# Patient Record
Sex: Female | Born: 1995
Health system: Southern US, Community
[De-identification: ages and names within clinical notes are randomized; demographics above are authoritative.]

## PROBLEM LIST (undated history)

## (undated) DIAGNOSIS — F419 Anxiety disorder, unspecified: Secondary | ICD-10-CM

## (undated) DIAGNOSIS — Z789 Other specified health status: Secondary | ICD-10-CM

## (undated) HISTORY — PX: INDUCED ABORTION: SHX677

## (undated) HISTORY — PX: WISDOM TOOTH EXTRACTION: SHX21

## (undated) HISTORY — DX: Anxiety disorder, unspecified: F41.9

---
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
## 2014-08-28 ENCOUNTER — Encounter (HOSPITAL_COMMUNITY): Payer: Self-pay | Admitting: Medical

## 2014-08-28 ENCOUNTER — Inpatient Hospital Stay (HOSPITAL_COMMUNITY)
Admission: AD | Admit: 2014-08-28 | Discharge: 2014-08-28 | Disposition: A | Discharge status: Home / Self Care | Payer: BC Managed Care – PPO | Source: Ambulatory Visit | Attending: Obstetrics & Gynecology | Admitting: Obstetrics & Gynecology

## 2014-08-28 DIAGNOSIS — Z3201 Encounter for pregnancy test, result positive: Secondary | ICD-10-CM | POA: Diagnosis present

## 2014-08-28 LAB — URINALYSIS, ROUTINE W REFLEX MICROSCOPIC
Bilirubin Urine: NEGATIVE
Glucose, UA: NEGATIVE mg/dL
Hgb urine dipstick: NEGATIVE
Ketones, ur: NEGATIVE mg/dL
Leukocytes, UA: NEGATIVE
Nitrite: NEGATIVE
PH: 6 (ref 5.0–8.0)
Protein, ur: NEGATIVE mg/dL
Specific Gravity, Urine: 1.025 (ref 1.005–1.030)
Urobilinogen, UA: 0.2 mg/dL (ref 0.0–1.0)

## 2014-08-28 LAB — POCT PREGNANCY, URINE: PREG TEST UR: POSITIVE — AB

## 2014-08-28 NOTE — Discharge Instructions (Signed)
Prenatal Care  WHAT IS PRENATAL CARE?  Prenatal care means health care during your pregnancy, before your baby is born. It is very important to take care of yourself and your baby during your pregnancy by:   Getting early prenatal care. If you know you are pregnant, or think you might be pregnant, call your health care provider as soon as possible. Schedule a visit for a prenatal exam.  Getting regular prenatal care. Follow your health care provider's schedule for blood and other necessary tests. Do not miss appointments.  Doing everything you can to keep yourself and your baby healthy during your pregnancy.  Getting complete care. Prenatal care should include evaluation of the medical, dietary, educational, psychological, and social needs of you and your significant other. The medical and genetic history of your family and the family of your baby's father should be discussed with your health care provider.  Discussing with your health care provider:  Prescription, over-the-counter, and herbal medicines that you take.  Any history of substance abuse, alcohol use, smoking, and illegal drug use.  Any history of domestic abuse and violence.  Immunizations you have received.  Your nutrition and diet.  The amount of exercise you do.  Any environmental and occupational hazards to which you are exposed.  History of sexually transmitted infections for both you and your partner.  Previous pregnancies you have had. WHY IS PRENATAL CARE SO IMPORTANT?  By regularly seeing your health care provider, you help ensure that problems can be identified early so that they can be treated as soon as possible. Other problems might be prevented. Many studies have shown that early and regular prenatal care is important for the health of mothers and their babies.  HOW CAN I TAKE CARE OF MYSELF WHILE I AM PREGNANT?  Here are ways to take care of yourself and your baby:   Start or continue taking your  multivitamin with 400 micrograms (mcg) of folic acid every day.  Get early and regular prenatal care. It is very important to see a health care provider during your pregnancy. Your health care provider will check at each visit to make sure that you and your baby are healthy. If there are any problems, action can be taken right away to help you and your baby.  Eat a healthy diet that includes:  Fruits.  Vegetables.  Foods low in saturated fat.  Whole grains.  Calcium-rich foods, such as milk, yogurt, and hard cheeses.  Drink 6-8 glasses of liquids a day.  Unless your health care provider tells you not to, try to be physically active for 30 minutes, most days of the week. If you are pressed for time, you can get your activity in through 10-minute segments, three times a day.  Do not smoke, drink alcohol, or use drugs. These can cause long-term damage to your baby. Talk with your health care provider about steps to take to stop smoking. Talk with a member of your faith community, a counselor, a trusted friend, or your health care provider if you are concerned about your alcohol or drug use.  Ask your health care provider before taking any medicine, even over-the-counter medicines. Some medicines are not safe to take during pregnancy.  Get plenty of rest and sleep.  Avoid hot tubs and saunas during pregnancy.  Do not have X-rays taken unless absolutely necessary and with the recommendation of your health care provider. A lead shield can be placed on your abdomen to protect your baby when   X-rays are taken in other parts of your body.  Do not empty the cat litter when you are pregnant. It may contain a parasite that causes an infection called toxoplasmosis, which can cause birth defects. Also, use gloves when working in garden areas used by cats.  Do not eat uncooked or undercooked meats or fish.  Do not eat soft, mold-ripened cheeses (Brie, Camembert, and chevre) or soft, blue-veined  cheese (Danish blue and Roquefort).  Stay away from toxic chemicals like:  Insecticides.  Solvents (some cleaners or paint thinners).  Lead.  Mercury.  Sexual intercourse may continue until the end of the pregnancy, unless you have a medical problem or there is a problem with the pregnancy and your health care provider tells you not to.  Do not wear high-heel shoes, especially during the second half of the pregnancy. You can lose your balance and fall.  Do not take long trips, unless absolutely necessary. Be sure to see your health care provider before going on the trip.  Do not sit in one position for more than 2 hours when on a trip.  Take a copy of your medical records when going on a trip. Know where a hospital is located in the city you are visiting, in case of an emergency.  Most dangerous household products will have pregnancy warnings on their labels. Ask your health care provider about products if you are unsure.  Limit or eliminate your caffeine intake from coffee, tea, sodas, medicines, and chocolate.  Many women continue working through pregnancy. Staying active might help you stay healthier. If you have a question about the safety or the hours you work at your particular job, talk with your health care provider.  Get informed:  Read books.  Watch videos.  Go to childbirth classes for you and your significant other.  Talk with experienced moms.  Ask your health care provider about childbirth education classes for you and your partner. Classes can help you and your partner prepare for the birth of your baby.  Ask about a baby doctor (pediatrician) and methods and pain medicine for labor, delivery, and possible cesarean delivery. HOW OFTEN SHOULD I SEE MY HEALTH CARE PROVIDER DURING PREGNANCY?  Your health care provider will give you a schedule for your prenatal visits. You will have visits more often as you get closer to the end of your pregnancy. An average  pregnancy lasts about 40 weeks.  A typical schedule includes visiting your health care provider:   About once each month during your first 6 months of pregnancy.  Every 2 weeks during the next 2 months.  Weekly in the last month, until the delivery date. Your health care provider will probably want to see you more often if:  You are older than 35 years.  Your pregnancy is high risk because you have certain health problems or problems with the pregnancy, such as:  Diabetes.  High blood pressure.  The baby is not growing on schedule, according to the dates of the pregnancy. Your health care provider will do special tests to make sure you and your baby are not having any serious problems. WHAT HAPPENS DURING PRENATAL VISITS?   At your first prenatal visit, your health care provider will do a physical exam and talk to you about your health history and the health history of your partner and your family. Your health care provider will be able to tell you what date to expect your baby to be born on.  Your   first physical exam will include checks of your blood pressure, measurements of your height and weight, and an exam of your pelvic organs. Your health care provider will do a Pap test if you have not had one recently and will do cultures of your cervix to make sure there is no infection.  At each prenatal visit, there will be tests of your blood, urine, blood pressure, weight, and the progress of the baby will be checked.  At your later prenatal visits, your health care provider will check how you are doing and how your baby is developing. You may have a number of tests done as your pregnancy progresses.  Ultrasound exams are often used to check on your baby's growth and health.  You may have more urine and blood tests, as well as special tests, if needed. These may include amniocentesis to examine fluid in the pregnancy sac, stress tests to check how the baby responds to contractions, or a  biophysical profile to measure your baby's well-being. Your health care provider will explain the tests and why they are necessary.  You should be tested for high blood sugar (gestational diabetes) between the 24th and 28th weeks of your pregnancy.  You should discuss with your health care provider your plans to breastfeed or bottle-feed your baby.  Each visit is also a chance for you to learn about staying healthy during pregnancy and to ask questions. Document Released: 09/21/2003 Document Revised: 09/23/2013 Document Reviewed: 12/03/2013 Exit[employer] Patient Information 2015 [employer]. This information is not intended to replace advice given to you by your health care provider. Make sure you discuss any questions you have with your health care provider.  

## 2014-08-28 NOTE — MAU Provider Note (Signed)
Ms. Zackery BarefootDanesha Lazalde is a 18 y.o. G1P0 at 3936w2d by sure LMP who presents to MAU today for pregnancy confirmation. The patient states recent +HPT. She denies abdominal pain, vaginal bleeding or fever. She states mild nausea without vomiting.   BP 114/63 mmHg  Pulse 78  Temp(Src) 98.8 F (37.1 C)  Resp 18  Ht 5\' 5"  (1.651 m)  Wt 184 lb 3.2 oz (83.553 kg)  BMI 30.65 kg/m2  LMP 06/03/2014 (Exact Date) GENERAL: Well-developed, well-nourished female in no acute distress.  HEENT: Normocephalic, atraumatic.   LUNGS: Effort normal HEART: Regular rate  SKIN: Warm, dry and without erythema PSYCH: Normal mood and affect  Results for orders placed or performed during the hospital encounter of 08/28/14 (from the past 24 hour(s))  Pregnancy, urine POC     Status: Abnormal   Collection Time: 08/28/14  7:51 PM  Result Value Ref Range   Preg Test, Ur POSITIVE (A) NEGATIVE    A: Positive pregnancy test  A: Discharge home Pregnancy confirmation letter and list of area OB providers given First trimester warning signs reviewed Patient may return to [facility]U as needed or if her condition were to change or worsen  Marny LowensteinJulie N Wenzel, PA-C  08/28/2014 8:20 PM

## 2014-08-28 NOTE — MAU Note (Signed)
Last period i had was in September. Took home upt and was positive. No bleeding but occ abd cramping.

## 2014-08-28 NOTE — MAU Note (Signed)
Jasmine NippleJulie Wenzel PA spoke with pt in room and then d/c home. Pt only wanted upt to confirm pregnancy.

## 2014-09-17 ENCOUNTER — Encounter (HOSPITAL_COMMUNITY): Payer: Self-pay | Admitting: Emergency Medicine

## 2014-09-17 ENCOUNTER — Emergency Department (HOSPITAL_COMMUNITY): Payer: BC Managed Care – PPO

## 2014-09-17 ENCOUNTER — Emergency Department (HOSPITAL_COMMUNITY)
Admission: EM | Admit: 2014-09-17 | Discharge: 2014-09-18 | Disposition: A | Discharge status: Home / Self Care | Payer: BC Managed Care – PPO | Attending: Emergency Medicine | Admitting: Emergency Medicine

## 2014-09-17 DIAGNOSIS — T1490XA Injury, unspecified, initial encounter: Secondary | ICD-10-CM

## 2014-09-17 DIAGNOSIS — Y92410 Unspecified street and highway as the place of occurrence of the external cause: Secondary | ICD-10-CM | POA: Insufficient documentation

## 2014-09-17 DIAGNOSIS — S20219A Contusion of unspecified front wall of thorax, initial encounter: Secondary | ICD-10-CM | POA: Diagnosis not present

## 2014-09-17 DIAGNOSIS — S60211A Contusion of right wrist, initial encounter: Secondary | ICD-10-CM | POA: Diagnosis not present

## 2014-09-17 DIAGNOSIS — Y998 Other external cause status: Secondary | ICD-10-CM | POA: Diagnosis not present

## 2014-09-17 DIAGNOSIS — Z79899 Other long term (current) drug therapy: Secondary | ICD-10-CM | POA: Diagnosis not present

## 2014-09-17 DIAGNOSIS — Y9389 Activity, other specified: Secondary | ICD-10-CM | POA: Diagnosis not present

## 2014-09-17 DIAGNOSIS — S60221A Contusion of right hand, initial encounter: Secondary | ICD-10-CM

## 2014-09-17 DIAGNOSIS — S6991XA Unspecified injury of right wrist, hand and finger(s), initial encounter: Secondary | ICD-10-CM | POA: Diagnosis present
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
## 2014-09-18 DIAGNOSIS — S60211A Contusion of right wrist, initial encounter: Secondary | ICD-10-CM | POA: Diagnosis not present

## 2014-09-18 MED ORDER — OXYCODONE-ACETAMINOPHEN 5-325 MG PO TABS
1.0000 | ORAL_TABLET | Freq: Once | ORAL | Status: AC
  Administered 2014-09-18: 1 via ORAL
  Filled 2014-09-18: qty 1

## 2014-09-18 MED ORDER — IBUPROFEN 800 MG PO TABS: 800.0000 mg | ORAL_TABLET | Freq: Three times a day (TID) | ORAL | Status: DC

## 2014-09-18 MED ORDER — HYDROCODONE-ACETAMINOPHEN 5-325 MG PO TABS: 1.0000 | ORAL_TABLET | Freq: Four times a day (QID) | ORAL | Status: DC | PRN

## 2014-09-18 NOTE — Discharge Instructions (Signed)
Call for a follow up appointment with a Family or Primary Care Provider.  Return if Symptoms worsen.   Take medication as prescribed.  Ice your contusions 3-4 times a day. Take ibuprofen every 8 hours as indicated on bottle. Do not operate heavy she, drink alcohol taking narcotic pain medication.

## 2014-10-25 ENCOUNTER — Encounter (HOSPITAL_COMMUNITY): Payer: Self-pay | Admitting: Emergency Medicine

## 2014-10-25 ENCOUNTER — Emergency Department (HOSPITAL_COMMUNITY)
Admission: EM | Admit: 2014-10-25 | Discharge: 2014-10-25 | Disposition: A | Discharge status: Home / Self Care | Payer: BLUE CROSS/BLUE SHIELD | Attending: Emergency Medicine | Admitting: Emergency Medicine

## 2014-10-25 ENCOUNTER — Emergency Department (HOSPITAL_COMMUNITY): Payer: BLUE CROSS/BLUE SHIELD

## 2014-10-25 DIAGNOSIS — Z79899 Other long term (current) drug therapy: Secondary | ICD-10-CM | POA: Insufficient documentation

## 2014-10-25 DIAGNOSIS — Z3202 Encounter for pregnancy test, result negative: Secondary | ICD-10-CM | POA: Insufficient documentation

## 2014-10-25 DIAGNOSIS — M549 Dorsalgia, unspecified: Secondary | ICD-10-CM

## 2014-10-25 DIAGNOSIS — M25531 Pain in right wrist: Secondary | ICD-10-CM | POA: Insufficient documentation

## 2014-10-25 DIAGNOSIS — M545 Low back pain: Secondary | ICD-10-CM | POA: Diagnosis present

## 2014-10-25 DIAGNOSIS — Z793 Long term (current) use of hormonal contraceptives: Secondary | ICD-10-CM | POA: Insufficient documentation

## 2014-10-25 DIAGNOSIS — G8911 Acute pain due to trauma: Secondary | ICD-10-CM | POA: Insufficient documentation

## 2014-10-25 LAB — URINALYSIS, ROUTINE W REFLEX MICROSCOPIC
Bilirubin Urine: NEGATIVE
GLUCOSE, UA: NEGATIVE mg/dL
HGB URINE DIPSTICK: NEGATIVE
Ketones, ur: NEGATIVE mg/dL
Leukocytes, UA: NEGATIVE
Nitrite: NEGATIVE
PH: 5.5 (ref 5.0–8.0)
Protein, ur: NEGATIVE mg/dL
Specific Gravity, Urine: 1.025 (ref 1.005–1.030)
UROBILINOGEN UA: 0.2 mg/dL (ref 0.0–1.0)

## 2014-10-25 LAB — POC URINE PREG, ED: Preg Test, Ur: NEGATIVE

## 2014-10-25 MED ORDER — CYCLOBENZAPRINE HCL 10 MG PO TABS: 5.0000 mg | ORAL_TABLET | Freq: Two times a day (BID) | ORAL | Status: DC | PRN
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
## 2014-10-25 NOTE — ED Notes (Signed)
Patient transported to X-ray 
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
## 2014-10-25 NOTE — Discharge Instructions (Signed)
Please call your doctor for a followup appointment within 24-48 hours. When you talk to your doctor please let them know that you were seen in the emergency department and have them acquire all of your records so that they can discuss the findings with you and formulate a treatment plan to fully care for your new and ongoing problems. Please call and set-up an appointment with Orthopedics Please rest and stay hydrated Please take medications as prescribed-Flexeril as a muscle relaxant this can lead to drowsiness. There is to be no drinking alcohol, driving, operating any heavy machinery while on the medications. Please take medications on a full stomach Please apply heat and warm compressions and massage Please follow-up with your chiropractor Please avoid any physical strenuous activity or heavy lifting Please continue to monitor symptoms closely and if symptoms are to worsen or change (fever greater than 101, chills, sweating, nausea, vomiting, chest pain, shortness of breathe, difficulty breathing, weakness, numbness, tingling, worsening or changes to pain pattern, fall, injury, numbness, loss sensation to legs, inability to control urine or bowel movements) please report back to the Emergency Department immediately.    Back Exercises Back exercises help treat and prevent back injuries. The goal is to increase your strength in your belly (abdominal) and back muscles. These exercises can also help with flexibility. Start these exercises when told by your doctor. HOME CARE Back exercises include: Pelvic Tilt.  Lie on your back with your knees bent. Tilt your pelvis until the lower part of your back is against the floor. Hold this position 5 to 10 sec. Repeat this exercise 5 to 10 times. Knee to Chest.  Pull 1 knee up against your chest and hold for 20 to 30 seconds. Repeat this with the other knee. This may be done with the other leg straight or bent, whichever feels better. Then, pull both  knees up against your chest. Sit-Ups or Curl-Ups.  Bend your knees 90 degrees. Start with tilting your pelvis, and do a partial, slow sit-up. Only lift your upper half 30 to 45 degrees off the floor. Take at least 2 to 3 seonds for each sit-up. Do not do sit-ups with your knees out straight. If partial sit-ups are difficult, simply do the above but with only tightening your belly (abdominal) muscles and holding it as told. Hip-Lift.  Lie on your back with your knees flexed 90 degrees. Push down with your feet and shoulders as you raise your hips 2 inches off the floor. Hold for 10 seconds, repeat 5 to 10 times. Back Arches.  Lie on your stomach. Prop yourself up on bent elbows. Slowly press on your hands, causing an arch in your low back. Repeat 3 to 5 times. Shoulder-Lifts.  Lie face down with arms beside your body. Keep hips and belly pressed to floor as you slowly lift your head and shoulders off the floor. Do not overdo your exercises. Be careful in the beginning. Exercises may cause you some mild back discomfort. If the pain lasts for more than 15 minutes, stop the exercises until you see your doctor. Improvement with exercise for back problems is slow.  Document Released: 10/21/2010 Document Revised: 12/11/2011 Document Reviewed: 07/20/2011 [facility] Patient Information 2015 [place], [place]. This information is not intended to replace advice given to you by your health care provider. Make sure you discuss any questions you have with your health care provider. Back Pain, Adult Back pain is very common. The pain often gets better over time. The cause of  back pain is usually not dangerous. Most people can learn to manage their back pain on their own.  HOME CARE   Stay active. Start with short walks on flat ground if you can. Try to walk farther each day.  Do not sit, drive, or stand in one place for more than 30 minutes. Do not stay in bed.  Do not avoid exercise or work. Activity can help  your back heal faster.  Be careful when you bend or lift an object. Bend at your knees, keep the object close to you, and do not twist.  Sleep on a firm mattress. Lie on your side, and bend your knees. If you lie on your back, put a pillow under your knees.  Only take medicines as told by your doctor.  Put ice on the injured area.  Put ice in a plastic bag.  Place a towel between your skin and the bag.  Leave the ice on for 15-20 minutes, 03-04 times a day for the first 2 to 3 days. After that, you can switch between ice and heat packs.  Ask your doctor about back exercises or massage.  Avoid feeling anxious or stressed. Find good ways to deal with stress, such as exercise. GET HELP RIGHT AWAY IF:   Your pain does not go away with rest or medicine.  Your pain does not go away in 1 week.  You have new problems.  You do not feel well.  The pain spreads into your legs.  You cannot control when you poop (bowel movement) or pee (urinate).  Your arms or legs feel weak or lose feeling (numbness).  You feel sick to your stomach (nauseous) or throw up (vomit).  You have belly (abdominal) pain.  You feel like you may pass out (faint). MAKE SURE YOU:   Understand these instructions.  Will watch your condition.  Will get help right away if you are not doing well or get worse. Document Released: 03/06/2008 Document Revised: 12/11/2011 Document Reviewed: 01/20/2014 [facility] Patient Information 2015 [employer], [place]. This information is not intended to replace advice given to you by your health care provider. Make sure you discuss any questions you have with your health care provider.   Emergency Department Resource Guide 1) Find a Doctor and Pay Out of Pocket Although you won't have to find out who is covered by your insurance plan, it is a good idea to ask around and get recommendations. You will then need to call the office and see if the doctor you have chosen will accept you  as a new patient and what types of options they offer for patients who are self-pay. Some doctors offer discounts or will set up payment plans for their patients who do not have insurance, but you will need to ask so you aren't surprised when you get to your appointment.  2) Contact Your Local Health Department Not all health departments have doctors that can see patients for sick visits, but many do, so it is worth a call to see if yours does. If you don't know where your local health department is, you can check in your phone book. The CDC also has a tool to help you locate your state's health department, and many state websites also have listings of all of their local health departments.  3) Find a Walk-in Clinic If your illness is not likely to be very severe or complicated, you may want to try a walk in clinic. These are popping up  all over the country in pharmacies, drugstores, and shopping centers. They're usually staffed by nurse practitioners or physician assistants that have been trained to treat common illnesses and complaints. They're usually fairly quick and inexpensive. However, if you have serious medical issues or chronic medical problems, these are probably not your best option.  No Primary Care Doctor: - Call Health Connect at  [phone] - they can help you locate a primary care doctor that  accepts your insurance, provides certain services, etc. - Physician Referral Service- [phone]  Chronic Pain Problems: Organization         Address  Phone   Notes  [facility]  [phone] Patients need to be referred by their primary care doctor.   Medication Assistance: Organization         Address  Phone   Notes  [facility] Medication [facility] [address] [place], [place] [address] [phone] --Must be a resident of [facility] -- Must have NO insurance coverage whatsoever (no Medicaid/ Medicare, etc.) -- The pt. MUST have  a primary care doctor that directs their care regularly and follows them in the community   MedAssist  [phone]   [employer]  [phone]    Agencies that provide inexpensive medical care: Organization         Address  Phone   Notes  [place]  [phone]   [facility]    [phone]   [facility] [place], [place] [address] [phone]   [place] [address] [place] [phone]   Planned Parenthood    [phone]   [facility]    [phone]   [place] [address], [place] Phone:  [phone], Fax:  [phone] Hours of Operation:  9 am - 6 pm, M-F.  Also accepts Medicaid/Medicare and self-pay.  [facility] [place] [address], [address], [place] Phone: [phone], Fax: [phone]. Hours of Operation:  8:30 am - 5:30 pm, M-F.  Also accepts Medicaid and self-pay.  [facility] [place] [address] [place] Phone: [phone]   [place], [place], [place] [phone], Ext. [phone] Mondays & Thursdays: 7-9 AM.  First 15 patients are seen on a first come, first serve basis.    [place] Providers:  Organization         Address  Phone   Notes  [facility] [address], [place] [phone] Also accepts self-pay patients.  [facility] [id number] [place], [place]  [phone]   [place], [address], [place] [phone]   [facility] [address], [place] [phone]   Lucianne Lei [address], [place]   [phone] Only accepts [place] Access [place] patients after they have their name applied to their card.   Self-Pay (no insurance) in [facility]:  Organization         Address  Phone   Notes  Sickle Cell Patients, [facility] [place] [phone]   [facility] Urgent Care [place] [phone]   [facility]gent [place]  [address] [place]  [address], [place] [phone]   [facility]/Dr. Osei-Bonsu  [address], [place] or [address], [place] [phone] Phone number for both [place] and [place] locations is the same.  Urgent Medical and [facility] [address], [place] [phone]   [facility] [address] [place] or [address] [phone]   [facility] [address] [place] [phone], phone; [phone], fax Sees patients 1st and 3rd Saturday of every month.  Must not qualify for public or private insurance (i.e. Medicaid, Medicare, [place] Health Choice, Veterans' Benefits)  Household income should be no more than 200% of the poverty level The clinic cannot treat you if you are pregnant or think you are pregnant  Sexually transmitted diseases are not treated at the clinic.    Dental Care: Organization         Address  Phone  Notes  [facility] Department of [facility] [place] [phone] Accepts children up to age 52 who are enrolled in [place] or [place]; pregnant women with a Medicaid card; and children who have applied for Medicaid or [facility] Health Choice, but were declined, whose parents can pay a reduced fee at time of service.  [facility] Department of [facility]  [address], [place] [phone] Accepts children up to age 45 who are enrolled in [place] or [place]; pregnant women with a Medicaid card; and children who have applied for Medicaid or  Health Choice, but were declined, whose parents can  pay a reduced fee at time of service.  [facility] Adult [facility] Access PROGRAM  [place] [phone] Patients are seen by appointment only. Walk-ins are not accepted. [place] will see patients 87 years of age and older. Monday - Tuesday (8am-5pm) Most Wednesdays (8:30-5pm) $30 per visit, cash only  [facility] Adult Dental Access PROGRAM  [address], [facility] [phone] Patients are seen by appointment only. Walk-ins are not accepted. [place] will see patients 52 years of age and older. One Wednesday Evening (Monthly: Volunteer Based).  $30 per visit, cash only  [place]  [phone] for adults; Children under age 52, call Graduate Pediatric Dentistry at [phone]. Children aged 52-14, please call [phone] to request a pediatric application.  Dental services are provided in all areas of dental care including fillings, crowns and bridges, complete and partial dentures, implants, gum treatment, root canals, and extractions. Preventive care is also provided. Treatment is provided to both adults and children. Patients are selected via a lottery and there is often a waiting list.   [facility] [address] [place]  [phone] [email or web address]   [facility] [address] [place], [place] [phone], Ext. [phone] Second and Fourth Thursday of each month, opens at 6:30 AM; Clinic ends at 9 AM.  Patients are seen on a first-come first-served basis, and a limited number are seen during each clinic.   [facility]  [address] Hillard Danker [place], [place] [phone]   Eligibility Requirements You must have lived in [place], [place], or [place] counties for at least the last three months.   You cannot be eligible for state or federal sponsored [employer], including [employer], [place], or [employer].  You generally cannot be eligible for healthcare  insurance through your employer.    How to apply: Eligibility screenings are held every Tuesday and Wednesday afternoon from 1:00 pm until 4:00 pm. You do not need an appointment for the interview!  [facility] [address] [place], [place]   [place]  [place] Department  [place]  [phone]    Behavioral Health Resources in the Community: Intensive Outpatient Programs Organization         Address  Phone  Notes  [place]. [address] [place], [place] [phone]   [facility] Outpatient [address], [place], [place]   ADS: Alcohol & Drug Svcs [address], [place], [place]   [place] [address],  [place], [place] or [phone]   Substance Abuse Resources Organization         Address  Phone  Notes  Alcohol and Drug Services  [phone]   [place]  [phone]   [facility] [place]   Chinita Pester  [phone]   Residential & Outpatient Substance Abuse Program  [phone]   Psychological Services Organization         Address  Phone  Notes  [facility]  [place]  [phone]   [place] [address] [place] or [phone]    Mobile Crisis Teams Organization         Address  Phone  Notes  [facility], Mobile Crisis Care Unit  [phone]   [facility]  [address] [place], [place]   [place] [address], [place] [phone]    Self-Help/Support Groups Organization         Address  Phone             Notes  [place]. of [place] - variety of support groups  [place] Call for more information  Narcotics Anonymous (NA),  [facility] [address], [employer] [place]  2 meetings at this location   Special educational needs teacher         Address  Phone  Notes  [facility],    [place]  1-236-766-3875   [facility]  [address], [place] [id number], [place], [place]   [place], [place] [phone] Admissions: 8am-3pm M-F  Incentives Substance [place] [address],    [place], [place] [phone]   [facility] [address] [place], [place], [place]   [facility] [address],  [place], [place]   [facility] Programs - Intensive Outpatient [address] Kristeen [address], [place], [place]   [facility] [place].,  [address], [place] [phone] or [phone]   [facility] ([facility]) [address], [place], [place] Accepts Medicaid  [facility] [place] [address]  [place] [phone]) [phone] Substance Abuse/Addiction Treatment   [facility] Organization         Address  Phone  Notes  [facility]  [phone]   Domenic Schwab, PhD [address] [place], [place]   [phone] or [phone]   [place]  [place], [place] [phone]   [facility] [address], [place], [place] [phone] Insurance/Medicaid/sponsorship through [facility] and Families [address], [address] [place], [place] [phone] [place], [place] [phone]    Dr. Adele Schilder  [phone]   [facility] Dept. 1) [address] [place] 2) [place] 3)  [address], [place] [phone]  [phone]    [place] [phone] or [phone] (After Hours)

## 2014-12-24 ENCOUNTER — Encounter (HOSPITAL_COMMUNITY): Payer: Self-pay | Admitting: Emergency Medicine

## 2014-12-24 ENCOUNTER — Emergency Department (HOSPITAL_COMMUNITY)
Admission: EM | Admit: 2014-12-24 | Discharge: 2014-12-24 | Disposition: A | Discharge status: Home / Self Care | Payer: BLUE CROSS/BLUE SHIELD | Attending: Emergency Medicine | Admitting: Emergency Medicine

## 2014-12-24 DIAGNOSIS — Z793 Long term (current) use of hormonal contraceptives: Secondary | ICD-10-CM | POA: Insufficient documentation

## 2014-12-24 DIAGNOSIS — Z87828 Personal history of other (healed) physical injury and trauma: Secondary | ICD-10-CM | POA: Insufficient documentation

## 2014-12-24 DIAGNOSIS — M545 Low back pain: Secondary | ICD-10-CM | POA: Insufficient documentation

## 2014-12-24 DIAGNOSIS — Z791 Long term (current) use of non-steroidal anti-inflammatories (NSAID): Secondary | ICD-10-CM | POA: Diagnosis not present

## 2014-12-24 DIAGNOSIS — Z79899 Other long term (current) drug therapy: Secondary | ICD-10-CM | POA: Insufficient documentation

## 2014-12-24 DIAGNOSIS — G8929 Other chronic pain: Secondary | ICD-10-CM | POA: Diagnosis not present

## 2014-12-24 DIAGNOSIS — M549 Dorsalgia, unspecified: Secondary | ICD-10-CM

## 2014-12-24 MED ORDER — MELOXICAM 7.5 MG PO TABS: 7.5000 mg | ORAL_TABLET | Freq: Every day | ORAL | Status: DC

## 2014-12-24 NOTE — ED Notes (Signed)
Pt c/o chronic back pain with exacerbation onset yesterday. Denies incontinence of bowel or bladder.

## 2014-12-24 NOTE — Discharge Instructions (Signed)
Read the information below.  Use the prescribed medication as directed.  Please discuss all new medications with your pharmacist.  You may return to the Emergency Department at any time for worsening condition or any new symptoms that concern you.   If there is any possibility that you might be pregnant, please let your health care provider know and discuss this with the pharmacist to ensure medication safety.   If you develop fevers, loss of control of bowel or bladder, weakness or numbness in your legs, or are unable to walk, return to the ER for a recheck.    Back Exercises These exercises may help you when beginning to rehabilitate your injury. Your symptoms may resolve with or without further involvement from your physician, physical therapist or athletic trainer. While completing these exercises, remember:   Restoring tissue flexibility helps normal motion to return to the joints. This allows healthier, less painful movement and activity.  An effective stretch should be held for at least 30 seconds.  A stretch should never be painful. You should only feel a gentle lengthening or release in the stretched tissue. STRETCH - Extension, Prone on Elbows   Lie on your stomach on the floor, a bed will be too soft. Place your palms about shoulder width apart and at the height of your head.  Place your elbows under your shoulders. If this is too painful, stack pillows under your chest.  Allow your body to relax so that your hips drop lower and make contact more completely with the floor.  Hold this position for __________ seconds.  Slowly return to lying flat on the floor. Repeat __________ times. Complete this exercise __________ times per day.  RANGE OF MOTION - Extension, Prone Press Ups   Lie on your stomach on the floor, a bed will be too soft. Place your palms about shoulder width apart and at the height of your head.  Keeping your back as relaxed as possible, slowly straighten your  elbows while keeping your hips on the floor. You may adjust the placement of your hands to maximize your comfort. As you gain motion, your hands will come more underneath your shoulders.  Hold this position __________ seconds.  Slowly return to lying flat on the floor. Repeat __________ times. Complete this exercise __________ times per day.  RANGE OF MOTION- Quadruped, Neutral Spine   Assume a hands and knees position on a firm surface. Keep your hands under your shoulders and your knees under your hips. You may place padding under your knees for comfort.  Drop your head and point your tail bone toward the ground below you. This will round out your low back like an angry cat. Hold this position for __________ seconds.  Slowly lift your head and release your tail bone so that your back sags into a large arch, like an old horse.  Hold this position for __________ seconds.  Repeat this until you feel limber in your low back.  Now, find your "sweet spot." This will be the most comfortable position somewhere between the two previous positions. This is your neutral spine. Once you have found this position, tense your stomach muscles to support your low back.  Hold this position for __________ seconds. Repeat __________ times. Complete this exercise __________ times per day.  STRETCH - Flexion, Single Knee to Chest   Lie on a firm bed or floor with both legs extended in front of you.  Keeping one leg in contact with the floor, bring  your opposite knee to your chest. Hold your leg in place by either grabbing behind your thigh or at your knee.  Pull until you feel a gentle stretch in your low back. Hold __________ seconds.  Slowly release your grasp and repeat the exercise with the opposite side. Repeat __________ times. Complete this exercise __________ times per day.  STRETCH - Hamstrings, Standing  Stand or sit and extend your right / left leg, placing your foot on a chair or foot  stool  Keeping a slight arch in your low back and your hips straight forward.  Lead with your chest and lean forward at the waist until you feel a gentle stretch in the back of your right / left knee or thigh. (When done correctly, this exercise requires leaning only a small distance.)  Hold this position for __________ seconds. Repeat __________ times. Complete this stretch __________ times per day. STRENGTHENING - Deep Abdominals, Pelvic Tilt   Lie on a firm bed or floor. Keeping your legs in front of you, bend your knees so they are both pointed toward the ceiling and your feet are flat on the floor.  Tense your lower abdominal muscles to press your low back into the floor. This motion will rotate your pelvis so that your tail bone is scooping upwards rather than pointing at your feet or into the floor.  With a gentle tension and even breathing, hold this position for __________ seconds. Repeat __________ times. Complete this exercise __________ times per day.  STRENGTHENING - Abdominals, Crunches   Lie on a firm bed or floor. Keeping your legs in front of you, bend your knees so they are both pointed toward the ceiling and your feet are flat on the floor. Cross your arms over your chest.  Slightly tip your chin down without bending your neck.  Tense your abdominals and slowly lift your trunk high enough to just clear your shoulder blades. Lifting higher can put excessive stress on the low back and does not further strengthen your abdominal muscles.  Control your return to the starting position. Repeat __________ times. Complete this exercise __________ times per day.  STRENGTHENING - Quadruped, Opposite UE/LE Lift   Assume a hands and knees position on a firm surface. Keep your hands under your shoulders and your knees under your hips. You may place padding under your knees for comfort.  Find your neutral spine and gently tense your abdominal muscles so that you can maintain this  position. Your shoulders and hips should form a rectangle that is parallel with the floor and is not twisted.  Keeping your trunk steady, lift your right hand no higher than your shoulder and then your left leg no higher than your hip. Make sure you are not holding your breath. Hold this position __________ seconds.  Continuing to keep your abdominal muscles tense and your back steady, slowly return to your starting position. Repeat with the opposite arm and leg. Repeat __________ times. Complete this exercise __________ times per day. Document Released: 10/06/2005 Document Revised: 12/11/2011 Document Reviewed: 12/31/2008 [facility] Patient Information 2015 [place]. This information is not intended to replace advice given to you by your health care provider. Make sure you discuss any questions you have with your health care provider.  Back Pain, Adult Low back pain is very common. About 1 in 5 people have back pain.The cause of low back pain is rarely dangerous. The pain often gets better over time.About half of people with a sudden onset  of back pain feel better in just 2 weeks. About 8 in 10 people feel better by 6 weeks.  CAUSES Some common causes of back pain include:  Strain of the muscles or ligaments supporting the spine.  Wear and tear (degeneration) of the spinal discs.  Arthritis.  Direct injury to the back. DIAGNOSIS Most of the time, the direct cause of low back pain is not known.However, back pain can be treated effectively even when the exact cause of the pain is unknown.Answering your caregiver's questions about your overall health and symptoms is one of the most accurate ways to make sure the cause of your pain is not dangerous. If your caregiver needs more information, he or she may order lab work or imaging tests (X-rays or MRIs).However, even if imaging tests show changes in your back, this usually does not require surgery. HOME CARE INSTRUCTIONS For many  people, back pain returns.Since low back pain is rarely dangerous, it is often a condition that people can learn to [facility] their own.   Remain active. It is stressful on the back to sit or stand in one place. Do not sit, drive, or stand in one place for more than 30 minutes at a time. Take short walks on level surfaces as soon as pain allows.Try to increase the length of time you walk each day.  Do not stay in bed.Resting more than 1 or 2 days can delay your recovery.  Do not avoid exercise or work.Your body is made to move.It is not dangerous to be active, even though your back may hurt.Your back will likely heal faster if you return to being active before your pain is gone.  Pay attention to your body when you bend and lift. Many people have less discomfortwhen lifting if they bend their knees, keep the load close to their bodies,and avoid twisting. Often, the most comfortable positions are those that put less stress on your recovering back.  Find a comfortable position to sleep. Use a firm mattress and lie on your side with your knees slightly bent. If you lie on your back, put a pillow under your knees.  Only take over-the-counter or prescription medicines as directed by your caregiver. Over-the-counter medicines to reduce pain and inflammation are often the most helpful.Your caregiver may prescribe muscle relaxant drugs.These medicines help dull your pain so you can more quickly return to your normal activities and healthy exercise.  Put ice on the injured area.  Put ice in a plastic bag.  Place a towel between your skin and the bag.  Leave the ice on for 15-20 minutes, 03-04 times a day for the first 2 to 3 days. After that, ice and heat may be alternated to reduce pain and spasms.  Ask your caregiver about trying back exercises and gentle massage. This may be of some benefit.  Avoid feeling anxious or stressed.Stress increases muscle tension and can worsen back pain.It  is important to recognize when you are anxious or stressed and learn ways to manage it.Exercise is a great option. SEEK MEDICAL CARE IF:  You have pain that is not relieved with rest or medicine.  You have pain that does not improve in 1 week.  You have new symptoms.  You are generally not feeling well. SEEK IMMEDIATE MEDICAL CARE IF:   You have pain that radiates from your back into your legs.  You develop new bowel or bladder control problems.  You have unusual weakness or numbness in your arms or legs.  You develop nausea or vomiting.  You develop abdominal pain.  You feel faint. Document Released: 09/18/2005 Document Revised: 03/19/2012 Document Reviewed: 01/20/2014 [facility] Patient Information 2015 [place], [place]. This information is not intended to replace advice given to you by your health care provider. Make sure you discuss any questions you have with your health care provider.    Emergency Department Resource Guide 1) Find a Doctor and Pay Out of Pocket Although you won't have to find out who is covered by your insurance plan, it is a good idea to ask around and get recommendations. You will then need to call the office and see if the doctor you have chosen will accept you as a new patient and what types of options they offer for patients who are self-pay. Some doctors offer discounts or will set up payment plans for their patients who do not have insurance, but you will need to ask so you aren't surprised when you get to your appointment.  2) Contact Your Local Health Department Not all health departments have doctors that can see patients for sick visits, but many do, so it is worth a call to see if yours does. If you don't know where your local health department is, you can check in your phone book. The CDC also has a tool to help you locate your state's health department, and many state websites also have listings of all of their local health departments.  3) Find a  Walk-in Clinic If your illness is not likely to be very severe or complicated, you may want to try a walk in clinic. These are popping up all over the country in pharmacies, drugstores, and shopping centers. They're usually staffed by nurse practitioners or physician assistants that have been trained to treat common illnesses and complaints. They're usually fairly quick and inexpensive. However, if you have serious medical issues or chronic medical problems, these are probably not your best option.  No Primary Care Doctor: - Call Health Connect at  [phone] - they can help you locate a primary care doctor that  accepts your insurance, provides certain services, etc. - Physician Referral Service- [phone]  Chronic Pain Problems: Organization         Address  Phone   Notes  [facility]  [phone] Patients need to be referred by their primary care doctor.   Medication Assistance: Organization         Address  Phone   Notes  [facility] Medication [facility] [address] [place], [place] [address] [phone] --Must be a resident of [facility] -- Must have NO insurance coverage whatsoever (no Medicaid/ Medicare, etc.) -- The pt. MUST have a primary care doctor that directs their care regularly and follows them in the community   MedAssist  [phone]   [employer]  [phone]    Agencies that provide inexpensive medical care: Organization         Address  Phone   Notes  [facility]  [phone]   [facility]    [phone]   [facility] [address] [place], [place] [address] [phone]   [facility] [address], [place] [phone]   Planned Parenthood    [phone]   [facility]    [phone]   Community Health and [facility]  [address], [place] Phone:  [phone],  Fax:  [phone]  [phone] Hours of Operation:  9 am - 6 pm, M-F.  Also accepts Medicaid/Medicare and self-pay.  [facility]  [address], [place] Phone: [phone], Fax: [phone]. Hours of Operation:  8:30 am - 5:30 pm, M-F.  Also accepts Medicaid and self-pay.  [facility] [place] [address], [place] Phone: [phone]   [facility] [address] Natasha Bence [place], [place] [phone], Ext. [phone] Mondays & Thursdays: 7-9 AM.  First 15 patients are seen on a first come, first serve basis.    Medicaid-accepting [facility] Providers:  Organization         Address  Phone   Notes  [facility] [address], [place] [phone] Also accepts self-pay patients.  [facility] [address] Laurell Josephs [place], [place]  [phone]   [facility] [address], [place] [phone]   [facility] [address], [place] [phone]   Renaye Rakers [address], [place]   [phone] Only accepts [place] Access [place] patients after they have their name applied to their card.   Self-Pay (no insurance) in [facility]:  Organization         Address  Phone   Notes  Sickle Cell Patients, [facility] Internal Medicine [address] [place], [place] [phone]   [facility] [address] [place], [place] [phone]   [facility] [place]  [address], [place] [phone]   [facility]/Dr. Osei-Bonsu  [address], [place] or [address], [place] [phone] Phone number for both [place] and [place] locations is the same.  [facility] [address] [place] [phone]   [facility] [address], [place] or [address] [phone]     [facility] [address] [place] [phone], phone; [phone], fax Sees patients 1st and 3rd Saturday of every month.  Must not qualify for public or private insurance (i.e. Medicaid, Medicare, [place] Health Choice, Veterans' Benefits)  Household income should be no more than 200% of the poverty level The clinic cannot treat you if you are pregnant or think you are pregnant  Sexually transmitted diseases are not treated at the clinic.    Dental Care: Organization         Address  Phone  Notes  [facility] [address] [place], [place] [phone] Accepts children up to age 71 who are enrolled in [place] or [place] Health Choice; pregnant women with a Medicaid card; and children who have applied for Medicaid or [place] Health Choice, but were declined, whose parents can pay a reduced fee at time of service.  [facility]  [address], [place] [phone] Accepts children up to age 68 who are enrolled in [place] or [place] Health Choice; pregnant women with a Medicaid card; and children who have applied for Medicaid or [place] Health Choice, but were declined, whose parents can pay a reduced fee at time of service.  [facility]  [address] [place], [place] [phone] Patients are seen by appointment only. Walk-ins are  not accepted. [facility] will see patients 34 years of age and older. Monday - Tuesday (8am-5pm) Most Wednesdays (8:30-5pm) $30 per visit, cash only  [facility] Adult Dental Access [facility]  [address], [facility] [phone] Patients are seen by appointment only. Walk-ins are not accepted. [facility] will see patients 75 years of age and older. One Wednesday Evening (Monthly: Volunteer Based).  $30 per visit, cash only  [employer]  [phone] for adults; Children under age 88, call  [facility] at [phone]. Children aged 55-14, please call [phone] to request a pediatric application.  Dental services are provided in all areas of dental care including fillings, crowns and bridges, complete and partial dentures, implants, gum treatment, root canals, and extractions. Preventive care is also provided. Treatment is provided to both adults and children. Patients are selected via a lottery and there is often a waiting list.   [facility] [address], [place]  [phone] [email or web address]   [facility] [address] [place], [place] [phone], Ext. [phone] Second and Fourth Thursday of each month, opens at 6:30 AM; Clinic ends at 9 AM.  Patients are seen on a first-come first-served basis, and a limited number are seen during each clinic.   [facility]  [address] Ether Griffins [place], [place] [phone]   Eligibility Requirements You must have lived in [place], [place], or [place] counties for at least the last three months.   You cannot be eligible for state or federal sponsored [place], including [employer], [place], or [employer].   You generally cannot be eligible for healthcare insurance through your employer.    How to apply: Eligibility screenings are held every Tuesday and Wednesday afternoon from 1:00 pm until 4:00 pm. You do not need an appointment for the interview!  [facility] [address] [place], [place] [phone]   [facility] Health Department  [phone]   [facility] Health Department  [phone]   [facility] Health Department  [phone]    Behavioral Health Resources in the Community: Intensive Outpatient Programs Organization         Address  Phone  Notes  [facility] Services [address], [place], [place] [phone]   [facility] Outpatient [address] [place], [place]  [phone]   [facility] [address], [place], [place]  [phone]   [facility] Mental Health [address]  [place], [place] [phone] or [phone]   Substance Abuse Resources Organization         Address  Phone  Notes  [facility] Drug Services  [phone]   [facility]  [phone]   [facility]  [phone]   Floydene Flock  [phone]   Residential & Outpatient Substance Abuse Program  [phone]   Psychological Services Organization         Address  Phone  Notes  [facility]  [phone]   [facility] Services  [phone]   [facility] Mental Health [address], [place] [phone] or [phone]    Mobile Crisis Teams Organization         Address  Phone  Notes  [facility], Mobile Crisis Care Unit  [phone]   [facility]  [address] [place], [place] [phone]   [facility] [address] [place] [phone]    Self-Help/Support  Groups Organization         Address  Phone             Notes  [facility] [place] - variety of support groups  336- I7437963 Call for more information  Narcotics Anonymous (NA), [facility] [address], [employer] [place]  2 meetings at this location   Statistician         Address  Phone  Notes  [facility] [address],    [place]  [phone]   [facility]  [address] [place] [address], [place], [place] [phone]   [facility] [address] [place], [place] [phone] Admissions: 8am-3pm M-F  [facility] [address],    [place], [place] [phone]   [facility] [address] [place], [place], [place] [phone]   [facility] [address],  [place], [place] [phone]   [facility] - Intensive Outpatient [address]ure[address], [place], [place] [phone]   [facility] ([facility] [address] [place].,  [place], [place] [phone] or [phone]   [facility] ([facility]) [address] [place], [place] [phone] Accepts Medicaid  [facility] [address]  [place] [phone] Substance Abuse/Addiction Treatment   [facility] Organization         Address  Phone  Notes  [facility]  7652763968   Angie Fava, PhD [address] Ervin Knack [place], [place]   [phone] or [phone]   [facility] Behavioral   [address] [place], [place] [phone]   [facility] [address], [place], [place] [phone] Insurance/Medicaid/sponsorship through [facility] and Families [address]                                    [place], [place] [phone] Therapy/tele-psych/case  [facility] [address], [place] [phone]    Dr. Lolly Mustache  [phone]   [facility]  [employer] [facility] Dept. 1) [address], [place] 2) [address], [place] 3)  [address], [place] [phone]  [phone]   [facility] Child Abuse Hotline 7573368804 or [phone] (After Hours)
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
## 2015-04-20 ENCOUNTER — Encounter (HOSPITAL_COMMUNITY): Payer: Self-pay | Admitting: *Deleted

## 2015-04-20 ENCOUNTER — Emergency Department (HOSPITAL_COMMUNITY): Payer: BLUE CROSS/BLUE SHIELD

## 2015-04-20 ENCOUNTER — Emergency Department (HOSPITAL_COMMUNITY)
Admission: EM | Admit: 2015-04-20 | Discharge: 2015-04-21 | Disposition: A | Discharge status: Home / Self Care | Payer: BLUE CROSS/BLUE SHIELD | Attending: Emergency Medicine | Admitting: Emergency Medicine

## 2015-04-20 DIAGNOSIS — Z3A22 22 weeks gestation of pregnancy: Secondary | ICD-10-CM | POA: Insufficient documentation

## 2015-04-20 DIAGNOSIS — R42 Dizziness and giddiness: Secondary | ICD-10-CM | POA: Insufficient documentation

## 2015-04-20 DIAGNOSIS — R109 Unspecified abdominal pain: Secondary | ICD-10-CM | POA: Diagnosis not present

## 2015-04-20 DIAGNOSIS — R5383 Other fatigue: Secondary | ICD-10-CM | POA: Insufficient documentation

## 2015-04-20 DIAGNOSIS — O99412 Diseases of the circulatory system complicating pregnancy, second trimester: Secondary | ICD-10-CM | POA: Insufficient documentation

## 2015-04-20 DIAGNOSIS — O4692 Antepartum hemorrhage, unspecified, second trimester: Secondary | ICD-10-CM | POA: Diagnosis present

## 2015-04-20 DIAGNOSIS — O9989 Other specified diseases and conditions complicating pregnancy, childbirth and the puerperium: Secondary | ICD-10-CM | POA: Insufficient documentation

## 2015-04-20 DIAGNOSIS — O209 Hemorrhage in early pregnancy, unspecified: Secondary | ICD-10-CM

## 2015-04-20 DIAGNOSIS — R630 Anorexia: Secondary | ICD-10-CM | POA: Insufficient documentation

## 2015-04-20 DIAGNOSIS — O99282 Endocrine, nutritional and metabolic diseases complicating pregnancy, second trimester: Secondary | ICD-10-CM | POA: Diagnosis not present

## 2015-04-20 DIAGNOSIS — D649 Anemia, unspecified: Secondary | ICD-10-CM | POA: Insufficient documentation

## 2015-04-20 DIAGNOSIS — N939 Abnormal uterine and vaginal bleeding, unspecified: Secondary | ICD-10-CM

## 2015-04-20 DIAGNOSIS — E876 Hypokalemia: Secondary | ICD-10-CM | POA: Diagnosis not present

## 2015-04-20 LAB — CBC WITH DIFFERENTIAL/PLATELET
Basophils Absolute: 0 10*3/uL (ref 0.0–0.1)
Basophils Relative: 0 % (ref 0–1)
EOS PCT: 2 % (ref 0–5)
Eosinophils Absolute: 0.2 10*3/uL (ref 0.0–0.7)
HCT: 34.2 % — ABNORMAL LOW (ref 36.0–46.0)
Hemoglobin: 11.7 g/dL — ABNORMAL LOW (ref 12.0–15.0)
Lymphocytes Relative: 42 % (ref 12–46)
Lymphs Abs: 3.1 10*3/uL (ref 0.7–4.0)
MCH: 29.6 pg (ref 26.0–34.0)
MCHC: 34.2 g/dL (ref 30.0–36.0)
MCV: 86.6 fL (ref 78.0–100.0)
Monocytes Absolute: 0.6 10*3/uL (ref 0.1–1.0)
Monocytes Relative: 8 % (ref 3–12)
NEUTROS PCT: 47 % (ref 43–77)
Neutro Abs: 3.5 10*3/uL (ref 1.7–7.7)
Platelets: 375 10*3/uL (ref 150–400)
RBC: 3.95 MIL/uL (ref 3.87–5.11)
RDW: 13.3 % (ref 11.5–15.5)
WBC: 7.4 10*3/uL (ref 4.0–10.5)

## 2015-04-20 LAB — BASIC METABOLIC PANEL
Anion gap: 9 (ref 5–15)
BUN: 7 mg/dL (ref 6–20)
CO2: 24 mmol/L (ref 22–32)
Calcium: 9.5 mg/dL (ref 8.9–10.3)
Chloride: 104 mmol/L (ref 101–111)
Creatinine, Ser: 0.76 mg/dL (ref 0.44–1.00)
GFR calc Af Amer: >60 mL/min (ref >60)
GFR calc non Af Amer: >60 mL/min (ref >60)
Glucose, Bld: 92 mg/dL (ref 65–99)
Potassium: 3.1 mmol/L — ABNORMAL LOW (ref 3.5–5.1)
Sodium: 137 mmol/L (ref 135–145)

## 2015-04-20 LAB — POC URINE PREG, ED: Preg Test, Ur: POSITIVE — AB

## 2015-04-20 LAB — ABO/RH: ABO/RH(D): O POS

## 2015-04-20 NOTE — ED Provider Notes (Signed)
CSN: [id number]     Arrival date & time 04/20/15  1832 History   First MD Initiated Contact with Patient 04/20/15 2204     Chief Complaint  Patient presents with  . Vaginal Bleeding     (Consider location/radiation/quality/duration/timing/severity/associated sxs/prior Treatment) The history is provided by the patient.     G1P0 with hx elective abortion last year p/w 5 weeks of abnormal vaginal bleeding and suprapubic cramping.  Is changing her pad every 3-4 hours.  Has been feeling lightheaded, fatigued, decreased appetite since the bleeding started.  6 days ago she passed a large clot that clogged her shower drain.  Denies fevers, chills, urinary symptoms, change in bowel habits, N/V, abnormal vaginal discharge that was not blood.   Pt unsure of last actual period.  She was last sexually active in May, did not have unprotected sex at that time.    History reviewed. No pertinent past medical history. History reviewed. No pertinent past surgical history. No family history on file. History  Substance Use Topics  . Smoking status: Never Smoker   . Smokeless tobacco: Not on file  . Alcohol Use: No   OB History    Gravida Para Term Preterm AB TAB SAB Ectopic Multiple Living   1              Review of Systems  All other systems reviewed and are negative.     Allergies  Review of patient's allergies indicates no known allergies.  Home Medications   Prior to Admission medications   Medication Sig Start Date End Date Taking? Authorizing Provider  meloxicam (MOBIC) 7.5 MG tablet Take 1 tablet (7.5 mg total) by mouth daily. Patient not taking: Reported on 04/20/2015 12/24/14   Trixie DredgeEmily Melodie Ashworth, PA-C  omeprazole (PRILOSEC) 20 MG capsule Take 1 capsule (20 mg total) by mouth daily. Patient not taking: Reported on 04/20/2015 07/08/13   Ivonne AndrewPeter Dammen, PA-C  ondansetron (ZOFRAN) 8 MG tablet Take 1 tablet (8 mg total) by mouth every 4 (four) hours as needed for nausea. Patient not taking: Reported  on 04/20/2015 07/08/13   Ivonne AndrewPeter Dammen, PA-C   BP 152/96 mmHg  Pulse 122  Temp(Src) 98.8 F (37.1 C) (Oral)  Resp 20  Ht 5\' 5"  (1.651 m)  Wt 180 lb (81.647 kg)  BMI 29.95 kg/m2  SpO2 100%  LMP 04/20/2015 Physical Exam  Constitutional: She appears well-developed and well-nourished. No distress.  HENT:  Head: Normocephalic and atraumatic.  Neck: Neck supple.  Pulmonary/Chest: Effort normal.  Abdominal: Soft. She exhibits no distension. There is no tenderness. There is no rebound and no guarding.  Genitourinary:  Small to moderate amount of dark blood in vagina.  Cervical os is closed.  Cervix nontender.  Bimanual without tenderness.    Neurological: She is alert.  Skin: She is not diaphoretic.  Nursing note and vitals reviewed.   ED Course  Procedures (including critical care time) Labs Review Labs Reviewed  BASIC METABOLIC PANEL - Abnormal; Notable for the following:    Potassium 3.1 (*)    All other components within normal limits  CBC WITH DIFFERENTIAL/PLATELET - Abnormal; Notable for the following:    Hemoglobin 11.7 (*)    HCT 34.2 (*)    All other components within normal limits  HCG, QUANTITATIVE, PREGNANCY - Abnormal; Notable for the following:    hCG, Beta Chain, Quant, S 1206 (*)    All other components within normal limits  POC URINE PREG, ED - Abnormal; Notable for the following:  Preg Test, Ur POSITIVE (*)    All other components within normal limits  ABO/RH    Imaging Review US Ob Comp Less 14 Wks  04/20/2015   CLINICAL DATA:  Acute onset of vaginal bleeding.  Initial encounter.  EXAM: OBSTETRIC <14 WK [place] AND TRANSVAGINAL OB US  TECHNIQUE: Both transabdominal and transvaginal ultrasound examinations were performed for complete evaluation of the gestation as well as the maternal uterus, adnexal regions, and pelvic cul-de-sac. Transvaginal technique was performed to assess early pregnancy.  COMPARISON:  None.  FINDINGS: Intrauterine gestational sac: None seen.   Yolk sac:  N/A  Embryo:  N/A  Maternal uterus/adnexae: An unusual area of significantly increased vascularity is noted at the lower uterine segment, within the uterine wall. It measures approximately 3.2 cm in size.  The ovaries are unremarkable in appearance. The right ovary measures 4.2 x 1.9 x 3.4 cm, while the left ovary measures 3.0 x 2.1 x 2.9 cm. No suspicious adnexal masses are seen; there is no evidence for ovarian torsion.  Trace free fluid is seen within the pelvic cul-de-sac.  IMPRESSION: 1. No evidence of intrauterine gestational sac. No evidence for ectopic pregnancy at this time. Would correlate with the quantitative beta HCG level. If it continues to trend upward, follow-up pelvic ultrasound could be considered in 2 weeks for further evaluation. 2. Unusual 3.2 cm area of significantly increased vascularity at the lower uterine segment, within the uterine wall. This may reflect a region of EMV (enhanced myometrial vascularity).   Electronically Signed   By: Roanna Raider M.D.   On: 04/20/2015 23:53   US Ob Transvaginal  04/20/2015   CLINICAL DATA:  Acute onset of vaginal bleeding.  Initial encounter.  EXAM: OBSTETRIC <14 WK [place] AND TRANSVAGINAL OB US  TECHNIQUE: Both transabdominal and transvaginal ultrasound examinations were performed for complete evaluation of the gestation as well as the maternal uterus, adnexal regions, and pelvic cul-de-sac. Transvaginal technique was performed to assess early pregnancy.  COMPARISON:  None.  FINDINGS: Intrauterine gestational sac: None seen.  Yolk sac:  N/A  Embryo:  N/A  Maternal uterus/adnexae: An unusual area of significantly increased vascularity is noted at the lower uterine segment, within the uterine wall. It measures approximately 3.2 cm in size.  The ovaries are unremarkable in appearance. The right ovary measures 4.2 x 1.9 x 3.4 cm, while the left ovary measures 3.0 x 2.1 x 2.9 cm. No suspicious adnexal masses are seen; there is no evidence for  ovarian torsion.  Trace free fluid is seen within the pelvic cul-de-sac.  IMPRESSION: 1. No evidence of intrauterine gestational sac. No evidence for ectopic pregnancy at this time. Would correlate with the quantitative beta HCG level. If it continues to trend upward, follow-up pelvic ultrasound could be considered in 2 weeks for further evaluation. 2. Unusual 3.2 cm area of significantly increased vascularity at the lower uterine segment, within the uterine wall. This may reflect a region of EMV (enhanced myometrial vascularity).   Electronically Signed   By: Roanna Raider M.D.   On: 04/20/2015 23:53     EKG Interpretation None      MDM   Final diagnoses:  Vaginal bleeding before [redacted] weeks gestation    G1P0 with hx elective abortion p/w vaginal bleeding x 5 weeks with suprapubic cramping.  Mild anemia 11.7.  Hypokalemic 3.1.  O positive blood type, will not need rhogam.   [place] without e/o intrauterine gestational sac or ectopic pregnancy.  There is increased vascularity in  lower uterine segment.   HCG quant 1206 - suspect spontaneous abortion has occurred and quant is currently decreasing, though there is the possibility that this is early pregnancy.  Pt advised of the importance of following up at [facility] in two days for recheck.  At end of my shift wet prep is pending.  Anticipate d/c home +/- antibiotics with [facility] follow up . Discussed pt with Earley Favor, NP, pending this result.  All other results including differential diagnosis and plan to this point discussed with patient.  Discussed result, findings, treatment, and follow up  with patient.  Pt given return precautions.  Pt verbalizes understanding and agrees with plan.        [place], PA-C 04/21/15 0130  Margarita Grizzle, MD 04/22/15 [phone]

## 2015-04-20 NOTE — ED Notes (Signed)
Pt c/o vaginal bleeding since before 04/02/15. States that she bleeding is heavy continuously and randomly will be light. Pt states that she cannot remember the last time she did not bleed. States that she is not taking any BC.

## 2015-04-21 LAB — WET PREP, GENITAL
TRICH WET PREP: NONE SEEN
Yeast Wet Prep HPF POC: NONE SEEN

## 2015-04-21 LAB — GC/CHLAMYDIA PROBE AMP (~~LOC~~) NOT AT ARMC
Chlamydia: NEGATIVE
Neisseria Gonorrhea: NEGATIVE

## 2015-04-21 LAB — HIV ANTIBODY (ROUTINE TESTING W REFLEX): HIV Screen 4th Generation wRfx: NONREACTIVE

## 2015-04-21 LAB — RPR: RPR: NONREACTIVE

## 2015-04-21 LAB — HCG, QUANTITATIVE, PREGNANCY: HCG, BETA CHAIN, QUANT, S: 1206 m[IU]/mL — AB (ref <5)

## 2015-04-21 MED ORDER — POTASSIUM CHLORIDE CRYS ER 20 MEQ PO TBCR
40.0000 meq | EXTENDED_RELEASE_TABLET | Freq: Once | ORAL | Status: AC
  Administered 2015-04-21: 40 meq via ORAL
  Filled 2015-04-21: qty 2

## 2015-04-21 NOTE — Discharge Instructions (Signed)
Read the information below.  You may return to the Emergency Department at any time for worsening condition or any new symptoms that concern you.   I suspect that you may have had a miscarriage though it is possible that you have a very early pregnancy.  It is very [facility] that [facility] two days for a recheck your HCG (pregnancy hormone) level.  Do not use tampons, continue to use pads.  If you develop fevers, increased abdominal pain, large amount of bleeding, vomiting, or you pass out, [facility] directly [facility]ck.     Pelvic Rest Pelvic rest is sometimes recommended for women when:   The placenta is partially or completely covering the opening of the cervix (placenta previa).  There is bleeding between the uterine wall and the amniotic sac in the first trimester (subchorionic hemorrhage).  The cervix begins to open without labor starting (incompetent cervix, cervical insufficiency).  The labor is too early (preterm labor). HOME CARE INSTRUCTIONS  Do not have sexual intercourse, stimulation, or an orgasm.  Do not use tampons, douche, or put anything in the vagina.  Do not lift anything over 10 pounds (4.5 kg).  Avoid strenuous activity or straining your pelvic muscles. SEEK MEDICAL CARE IF:  You have any vaginal bleeding during pregnancy. Treat this as a potential emergency.  You have cramping pain felt low in the stomach (stronger than menstrual cramps).  You notice vaginal discharge (watery, mucus, or bloody).  You have a low, dull backache.  There are regular contractions or uterine tightening. SEEK IMMEDIATE MEDICAL CARE IF: You have vaginal bleeding and have placenta previa.  Document Released: 01/13/2011 Document Revised: 12/11/2011 Document Reviewed: 01/13/2011 [facility] Patient Information 2015 [place]. This information is not intended to replace advice given to you by your health care provider. Make sure you discuss any  questions you have with your health care provider.  Vaginal Bleeding During Pregnancy, First Trimester A small amount of bleeding (spotting) from the vagina is relatively common in early pregnancy. It usually stops on its own. Various things may cause bleeding or spotting in early pregnancy. Some bleeding may be related to the pregnancy, and some may not. In most cases, the bleeding is normal and is not a problem. However, bleeding can also be a sign of something serious. Be sure to tell your health care provider about any vaginal bleeding right away. Some possible causes of vaginal bleeding during the first trimester include:  Infection or inflammation of the cervix.  Growths (polyps) on the cervix.  Miscarriage or threatened miscarriage.  Pregnancy tissue has developed outside of the uterus and in a fallopian tube (tubal pregnancy).  Tiny cysts have developed in the uterus instead of pregnancy tissue (molar pregnancy). HOME CARE INSTRUCTIONS  Watch your condition for any changes. The following actions may help to lessen any discomfort you are feeling:  Follow your health care provider's instructions for limiting your activity. If your health care provider orders bed rest, you may need to stay in bed and only get up to use the bathroom. However, your health care provider may allow you to continue light activity.  If needed, make plans for someone to help with your regular activities and responsibilities while you are on bed rest.  Keep track of the number of pads you use each day, how often you change pads, and how soaked (saturated) they are. Write this down.  Do not use tampons. Do not douche.  Do not have sexual intercourse or orgasms until approved by your health care provider.  If you pass any tissue from your vagina, save the tissue so you can show it to your health care provider.  Only take over-the-counter or prescription medicines as directed by your health care  provider.  Do not take aspirin because it can make you bleed.  Keep all follow-up appointments as directed by your health care provider. SEEK MEDICAL CARE IF:  You have any vaginal bleeding during any part of your pregnancy.  You have cramps or labor pains.  You have a fever, not controlled by medicine. SEEK IMMEDIATE MEDICAL CARE IF:   You have severe cramps in your back or belly (abdomen).  You pass large clots or tissue from your vagina.  Your bleeding increases.  You feel light-headed or weak, or you have fainting episodes.  You have chills.  You are leaking fluid or have a gush of fluid from your vagina.  You pass out while having a bowel movement. MAKE SURE YOU:  Understand these instructions.  Will watch your condition.  Will get help right away if you are not doing well or get worse. Document Released: 06/28/2005 Document Revised: 09/23/2013 Document Reviewed: 05/26/2013 [facility] Patient Information 2015 [place], [place]. This information is not intended to replace advice given to you by your health care provider. Make sure you discuss any questions you have with your health care provider.

## 2016-05-15 ENCOUNTER — Inpatient Hospital Stay (HOSPITAL_COMMUNITY)
Admission: AD | Admit: 2016-05-15 | Discharge: 2016-05-15 | Disposition: A | Discharge status: Home / Self Care | Payer: BLUE CROSS/BLUE SHIELD | Source: Ambulatory Visit | Attending: Obstetrics and Gynecology | Admitting: Obstetrics and Gynecology

## 2016-05-15 ENCOUNTER — Encounter (HOSPITAL_COMMUNITY): Payer: Self-pay | Admitting: *Deleted

## 2016-05-15 DIAGNOSIS — Z3202 Encounter for pregnancy test, result negative: Secondary | ICD-10-CM | POA: Diagnosis not present

## 2016-05-15 DIAGNOSIS — N939 Abnormal uterine and vaginal bleeding, unspecified: Secondary | ICD-10-CM | POA: Diagnosis present

## 2016-05-15 DIAGNOSIS — N946 Dysmenorrhea, unspecified: Secondary | ICD-10-CM | POA: Diagnosis not present

## 2016-05-15 DIAGNOSIS — N760 Acute vaginitis: Secondary | ICD-10-CM | POA: Insufficient documentation

## 2016-05-15 DIAGNOSIS — B9689 Other specified bacterial agents as the cause of diseases classified elsewhere: Secondary | ICD-10-CM | POA: Insufficient documentation

## 2016-05-15 HISTORY — DX: Other specified health status: Z78.9

## 2016-05-15 LAB — CBC WITH DIFFERENTIAL/PLATELET
BASOS ABS: 0 10*3/uL (ref 0.0–0.1)
BASOS PCT: 0 %
Eosinophils Absolute: 0.2 10*3/uL (ref 0.0–0.7)
Eosinophils Relative: 3 %
HEMATOCRIT: 35.2 % — AB (ref 36.0–46.0)
Hemoglobin: 12.1 g/dL (ref 12.0–15.0)
Lymphocytes Relative: 38 %
Lymphs Abs: 2.3 10*3/uL (ref 0.7–4.0)
MCH: 28.6 pg (ref 26.0–34.0)
MCHC: 34.4 g/dL (ref 30.0–36.0)
MCV: 83.2 fL (ref 78.0–100.0)
MONO ABS: 0.3 10*3/uL (ref 0.1–1.0)
Monocytes Relative: 4 %
NEUTROS ABS: 3.3 10*3/uL (ref 1.7–7.7)
Neutrophils Relative %: 55 %
PLATELETS: 363 10*3/uL (ref 150–400)
RBC: 4.23 MIL/uL (ref 3.87–5.11)
RDW: 15.4 % (ref 11.5–15.5)
WBC: 6 10*3/uL (ref 4.0–10.5)

## 2016-05-15 LAB — URINE MICROSCOPIC-ADD ON: WBC UA: NONE SEEN WBC/hpf (ref 0–5)

## 2016-05-15 LAB — URINALYSIS, ROUTINE W REFLEX MICROSCOPIC
Bilirubin Urine: NEGATIVE
Glucose, UA: NEGATIVE mg/dL
KETONES UR: NEGATIVE mg/dL
LEUKOCYTES UA: NEGATIVE
NITRITE: NEGATIVE
PH: 5 (ref 5.0–8.0)
Protein, ur: NEGATIVE mg/dL

## 2016-05-15 LAB — HCG, QUANTITATIVE, PREGNANCY: hCG, Beta Chain, Quant, S: <1 m[IU]/mL (ref <5)

## 2016-05-15 LAB — WET PREP, GENITAL
SPERM: NONE SEEN
Trich, Wet Prep: NONE SEEN
Yeast Wet Prep HPF POC: NONE SEEN

## 2016-05-15 LAB — POCT PREGNANCY, URINE: Preg Test, Ur: NEGATIVE

## 2016-05-15 MED ORDER — METRONIDAZOLE 500 MG PO TABS: 500.0000 mg | ORAL_TABLET | Freq: Two times a day (BID) | ORAL | 0 refills | Status: DC

## 2016-05-15 MED ORDER — KETOROLAC TROMETHAMINE 60 MG/2ML IM SOLN
60.0000 mg | Freq: Once | INTRAMUSCULAR | Status: AC
  Administered 2016-05-15: 60 mg via INTRAMUSCULAR
  Filled 2016-05-15: qty 2

## 2016-05-15 NOTE — MAU Note (Signed)
Pt states she has had severe period cramps and heavy flows since she started her period when she was 8.  She is on her period today.

## 2016-05-15 NOTE — Discharge Instructions (Signed)
Dysmenorrhea °Menstrual cramps (dysmenorrhea) are caused by the muscles of the uterus tightening (contracting) during a menstrual period. For some women, this discomfort is merely bothersome. For others, dysmenorrhea can be severe enough to interfere with everyday activities for a few days each month. °Primary dysmenorrhea is menstrual cramps that last a couple of days when you start having menstrual periods or soon after. This often begins after a teenager starts having her period. As a woman gets older or has a baby, the cramps will usually lessen or disappear. Secondary dysmenorrhea begins later in life, lasts longer, and the pain may be stronger than primary dysmenorrhea. The pain may start before the period and last a few days after the period.  °CAUSES  °Dysmenorrhea is usually caused by an underlying problem, such as: °· The tissue lining the uterus grows outside of the uterus in other areas of the body (endometriosis). °· The endometrial tissue, which normally lines the uterus, is found in or grows into the muscular walls of the uterus (adenomyosis). °· The pelvic blood vessels are engorged with blood just before the menstrual period (pelvic congestive syndrome). °· Overgrowth of cells (polyps) in the lining of the uterus or cervix. °· Falling down of the uterus (prolapse) because of loose or stretched ligaments. °· Depression. °· Bladder problems, infection, or inflammation. °· Problems with the intestine, a tumor, or irritable bowel syndrome. °· Cancer of the female organs or bladder. °· A severely tipped uterus. °· A very tight opening or closed cervix. °· Noncancerous tumors of the uterus (fibroids). °· Pelvic inflammatory disease (PID). °· Pelvic scarring (adhesions) from a previous surgery. °· Ovarian cyst. °· An intrauterine device (IUD) used for birth control. °RISK FACTORS °You may be at greater risk of dysmenorrhea if: °· You are younger than age 30. °· You started puberty early. °· You have  irregular or heavy bleeding. °· You have never given birth. °· You have a family history of this problem. °· You are a smoker. °SIGNS AND SYMPTOMS  °· Cramping or throbbing pain in your lower abdomen. °· Headaches. °· Lower back pain. °· Nausea or vomiting. °· Diarrhea. °· Sweating or dizziness. °· Loose stools. °DIAGNOSIS  °A diagnosis is based on your history, symptoms, physical exam, diagnostic tests, or procedures. Diagnostic tests or procedures may include: °· Blood tests. °· Ultrasonography. °· An examination of the lining of the uterus (dilation and curettage, D&C). °· An examination inside your abdomen or pelvis with a scope (laparoscopy). °· X-rays. °· CT scan. °· MRI. °· An examination inside the bladder with a scope (cystoscopy). °· An examination inside the intestine or stomach with a scope (colonoscopy, gastroscopy). °TREATMENT  °Treatment depends on the cause of the dysmenorrhea. Treatment may include: °· Pain medicine prescribed by your health care provider. °· Birth control pills or an IUD with progesterone hormone in it. °· Hormone replacement therapy. °· Nonsteroidal anti-inflammatory drugs (NSAIDs). These may help stop the production of prostaglandins. °· Surgery to remove adhesions, endometriosis, ovarian cyst, or fibroids. °· Removal of the uterus (hysterectomy). °· Progesterone shots to stop the menstrual period. °· Cutting the nerves on the sacrum that go to the female organs (presacral neurectomy). °· Electric current to the sacral nerves (sacral nerve stimulation). °· Antidepressant medicine. °· Psychiatric therapy, counseling, or group therapy. °· Exercise and physical therapy. °· Meditation and yoga therapy. °· Acupuncture. °HOME CARE INSTRUCTIONS  °· Only take over-the-counter or prescription medicines as directed by your health care provider. °· Place a heating pad   or hot water bottle on your lower back or abdomen. Do not sleep with the heating pad. °· Use aerobic exercises, walking,  swimming, biking, and other exercises to help lessen the cramping. °· Massage to the lower back or abdomen may help. °· Stop smoking. °· Avoid alcohol and caffeine. °SEEK MEDICAL CARE IF:  °· Your pain does not get better with medicine. °· You have pain with sexual intercourse. °· Your pain increases and is not controlled with medicines. °· You have abnormal vaginal bleeding with your period. °· You develop nausea or vomiting with your period that is not controlled with medicine. °SEEK IMMEDIATE MEDICAL CARE IF:  °You pass out.  °  °This information is not intended to replace advice given to you by your health care provider. Make sure you discuss any questions you have with your health care provider. °  °Document Released: 09/18/2005 Document Revised: 05/21/2013 Document Reviewed: 03/06/2013 °Elsevier Interactive Patient Education ©2016 Elsevier Inc. ° °Bacterial Vaginosis °Bacterial vaginosis is an infection of the vagina. It happens when too many germs (bacteria) grow in the vagina. Having this infection puts you at risk for getting other infections from sex. Treating this infection can help lower your risk for other infections, such as:  °· Chlamydia. °· Gonorrhea. °· HIV. °· Herpes. °HOME CARE °· Take your medicine as told by your doctor. °· Finish your medicine even if you start to feel better. °· Tell your sex partner that you have an infection. They should see their doctor for treatment. °· During treatment: °¨ Avoid sex or use condoms correctly. °¨ Do not douche. °¨ Do not drink alcohol unless your doctor tells you it is ok. °¨ Do not breastfeed unless your doctor tells you it is ok. °GET HELP IF: °· You are not getting better after 3 days of treatment. °· You have more grey fluid (discharge) coming from your vagina than before. °· You have more pain than before. °· You have a fever. °MAKE SURE YOU:  °· Understand these instructions. °· Will watch your condition. °· Will get help right away if you are not  doing well or get worse. °  °This information is not intended to replace advice given to you by your health care provider. Make sure you discuss any questions you have with your health care provider. °  °Document Released: 06/27/2008 Document Revised: 10/09/2014 Document Reviewed: 04/30/2013 °Elsevier Interactive Patient Education ©2016 Elsevier Inc. ° °

## 2016-05-15 NOTE — MAU Provider Note (Signed)
History     CSN: [id number]  Arrival date and time: 05/15/16 1124   First Provider Initiated Contact with Patient 05/15/16 1155      Chief Complaint  Patient presents with  . Vaginal Bleeding  . Abdominal Cramping   HPI Jasmine BarefootDanesha Ramanathan is 20 y.o. G2P0020  weeks presenting with heavy vaginal bleeding and cramping for since menarche at age 89.  Hx of EAB a year ago and a SAB on 7/19, seen in ED.  BHCG 1206, U/S did not see IUG or findings suggestive of Ectopic pregnancy.  Blood type O Positive.  She did not have follow up exam after miscarriage.  Is here today with concern of heavy, crampy cycles. Pain iis in the lower abdomen.  Rating pain as 10/10.   Has heavy flow for 4-7 days each month.  Tried OCPs for 2 months and forgot to take meds.   OTC meds for pain helpful but does not relieve pain.   Past Medical History:  Diagnosis Date  . Medical history non-contributory     Past Surgical History:  Procedure Laterality Date  . NO PAST SURGERIES      Family History  Problem Relation Age of Onset  . Alcohol abuse Neg Hx   . Arthritis Neg Hx   . Asthma Neg Hx   . Birth defects Neg Hx   . Cancer Neg Hx   . COPD Neg Hx   . Depression Neg Hx   . Diabetes Neg Hx   . Drug abuse Neg Hx   . Early death Neg Hx   . Hearing loss Neg Hx   . Heart disease Neg Hx   . Hyperlipidemia Neg Hx   . Hypertension Neg Hx   . Kidney disease Neg Hx   . Learning disabilities Neg Hx   . Mental illness Neg Hx   . Mental retardation Neg Hx   . Miscarriages / Stillbirths Neg Hx   . Stroke Neg Hx   . Vision loss Neg Hx   . Varicose Veins Neg Hx     Social History  Substance Use Topics  . Smoking status: Never Smoker  . Smokeless tobacco: Never Used  . Alcohol use No    Allergies: No Known Allergies  No prescriptions prior to admission.    Review of Systems  Constitutional: Negative for chills and fever.  Gastrointestinal: Positive for abdominal pain. Negative for nausea and vomiting.   Genitourinary: Negative for dysuria, frequency, hematuria and urgency.       + for vaginal bleeding, she is now on day 2 of her cycle.   Musculoskeletal: Negative for back pain.  Neurological: Negative for headaches.   Physical Exam   Blood pressure 127/89, pulse 72, temperature 98.1 F (36.7 C), temperature source Oral, resp. rate 16, last menstrual period 05/14/2016, SpO2 96 %, unknown if currently breastfeeding.  Physical Exam  Constitutional: She is oriented to person, place, and time. She appears well-developed and well-nourished. No distress.  HENT:  Head: Normocephalic.  Neck: Normal range of motion.  Cardiovascular: Normal rate.   Respiratory: Effort normal.  GI: Soft. She exhibits no distension and no mass. There is tenderness (mild lower bilateral and mid abdominal tenderness on exam. ). There is no rebound and no guarding.  Genitourinary: There is no rash, tenderness or lesion on the right labia. There is no rash, tenderness or lesion on the left labia. Uterus is tender (slightly tender on exam. ). Uterus is not deviated, not enlarged and not  fixed. Cervix exhibits no motion tenderness, no discharge and no friability. Right adnexum displays no mass, no tenderness and no fullness. Left adnexum displays no mass, no tenderness and no fullness. There is bleeding (moderate amount of menstrual blood with a few small clots) in the vagina. No erythema or tenderness in the vagina. No vaginal discharge found.  Neurological: She is alert and oriented to person, place, and time.  Skin: Skin is warm and dry.  Psychiatric: She has a normal mood and affect. Her behavior is normal.   Results for orders placed or performed during the hospital encounter of 05/15/16 (from the past 24 hour(s))  Urinalysis, Routine w reflex microscopic (not at [facility])     Status: Abnormal   Collection Time: 05/15/16 11:30 AM  Result Value Ref Range   Color, Urine YELLOW YELLOW   APPearance HAZY (A) CLEAR    Specific Gravity, Urine >1.030 (H) 1.005 - 1.030   pH 5.0 5.0 - 8.0   Glucose, UA NEGATIVE NEGATIVE mg/dL   Hgb urine dipstick LARGE (A) NEGATIVE   Bilirubin Urine NEGATIVE NEGATIVE   Ketones, ur NEGATIVE NEGATIVE mg/dL   Protein, ur NEGATIVE NEGATIVE mg/dL   Nitrite NEGATIVE NEGATIVE   Leukocytes, UA NEGATIVE NEGATIVE  Urine microscopic-add on     Status: Abnormal   Collection Time: 05/15/16 11:30 AM  Result Value Ref Range   Squamous Epithelial / LPF 0-5 (A) NONE SEEN   WBC, UA NONE SEEN 0 - 5 WBC/hpf   RBC / HPF TOO NUMEROUS TO COUNT 0 - 5 RBC/hpf   Bacteria, UA RARE (A) NONE SEEN  Pregnancy, urine POC     Status: None   Collection Time: 05/15/16 11:46 AM  Result Value Ref Range   Preg Test, Ur NEGATIVE NEGATIVE  Wet prep, genital     Status: Abnormal   Collection Time: 05/15/16 12:25 PM  Result Value Ref Range   Yeast Wet Prep HPF POC NONE SEEN NONE SEEN   Trich, Wet Prep NONE SEEN NONE SEEN   Clue Cells Wet Prep HPF POC PRESENT (A) NONE SEEN   WBC, Wet Prep HPF POC FEW (A) NONE SEEN   Sperm NONE SEEN   CBC with Differential/Platelet     Status: Abnormal   Collection Time: 05/15/16  1:08 PM  Result Value Ref Range   WBC 6.0 4.0 - 10.5 K/uL   RBC 4.23 3.87 - 5.11 MIL/uL   Hemoglobin 12.1 12.0 - 15.0 g/dL   HCT 40.9 (L) 81.1 - 91.4 %   MCV 83.2 78.0 - 100.0 fL   MCH 28.6 26.0 - 34.0 pg   MCHC 34.4 30.0 - 36.0 g/dL   RDW 78.2 95.6 - 21.3 %   Platelets 363 150 - 400 K/uL   Neutrophils Relative % 55 %   Neutro Abs 3.3 1.7 - 7.7 K/uL   Lymphocytes Relative 38 %   Lymphs Abs 2.3 0.7 - 4.0 K/uL   Monocytes Relative 4 %   Monocytes Absolute 0.3 0.1 - 1.0 K/uL   Eosinophils Relative 3 %   Eosinophils Absolute 0.2 0.0 - 0.7 K/uL   Basophils Relative 0 %   Basophils Absolute 0.0 0.0 - 0.1 K/uL    MAU Course  Procedures  MDM MSE Exam Labs GC/CHL culture pending Toradol 60mg  IM given in MAU Rx for Flagyl to treat BV  Patient is having less discomfort after Toradol  injection.  She was instructed not to take any additional Ibuprofen or Naprosyn until tomorrow.  Assessment and Plan  A:  Dysmenorrhea      S/P SAB 04/19/16      Bacterial Vaginosis      Negative UPT  P:  Phone number given to patient [facility]       Rx for Flagyl--take all med as prescribed.  Pelvic rest X 1 week.       Encouraged patient to consider contraception--she does not desire a pregnancy at this time.      Medical Student discussed all choices of contraception     KEY,EVE M 05/15/2016, 1:56 PM

## 2016-05-16 LAB — GC/CHLAMYDIA PROBE AMP (~~LOC~~) NOT AT ARMC
Chlamydia: NEGATIVE
NEISSERIA GONORRHEA: NEGATIVE

## 2017-05-19 ENCOUNTER — Encounter (HOSPITAL_COMMUNITY): Payer: Self-pay | Admitting: *Deleted

## 2017-05-19 ENCOUNTER — Inpatient Hospital Stay (HOSPITAL_COMMUNITY)
Admission: AD | Admit: 2017-05-19 | Discharge: 2017-05-19 | Disposition: A | Discharge status: Home / Self Care | Payer: BLUE CROSS/BLUE SHIELD | Source: Ambulatory Visit | Attending: Obstetrics and Gynecology | Admitting: Obstetrics and Gynecology

## 2017-05-19 DIAGNOSIS — B9689 Other specified bacterial agents as the cause of diseases classified elsewhere: Secondary | ICD-10-CM | POA: Insufficient documentation

## 2017-05-19 DIAGNOSIS — N760 Acute vaginitis: Secondary | ICD-10-CM | POA: Insufficient documentation

## 2017-05-19 DIAGNOSIS — F17200 Nicotine dependence, unspecified, uncomplicated: Secondary | ICD-10-CM | POA: Diagnosis not present

## 2017-05-19 DIAGNOSIS — R109 Unspecified abdominal pain: Secondary | ICD-10-CM | POA: Diagnosis present

## 2017-05-19 DIAGNOSIS — R102 Pelvic and perineal pain: Secondary | ICD-10-CM | POA: Diagnosis not present

## 2017-05-19 LAB — COMPREHENSIVE METABOLIC PANEL
ALT: 13 U/L — ABNORMAL LOW (ref 14–54)
AST: 18 U/L (ref 15–41)
Albumin: 4.2 g/dL (ref 3.5–5.0)
Alkaline Phosphatase: 42 U/L (ref 38–126)
Anion gap: 7 (ref 5–15)
BUN: 12 mg/dL (ref 6–20)
CHLORIDE: 105 mmol/L (ref 101–111)
CO2: 25 mmol/L (ref 22–32)
Calcium: 9.2 mg/dL (ref 8.9–10.3)
Creatinine, Ser: 0.78 mg/dL (ref 0.44–1.00)
Glucose, Bld: 86 mg/dL (ref 65–99)
POTASSIUM: 3.9 mmol/L (ref 3.5–5.1)
Sodium: 137 mmol/L (ref 135–145)
Total Bilirubin: 0.7 mg/dL (ref 0.3–1.2)
Total Protein: 7.3 g/dL (ref 6.5–8.1)

## 2017-05-19 LAB — WET PREP, GENITAL
SPERM: NONE SEEN
TRICH WET PREP: NONE SEEN
WBC WET PREP: NONE SEEN
YEAST WET PREP: NONE SEEN

## 2017-05-19 LAB — URINALYSIS, ROUTINE W REFLEX MICROSCOPIC
Bilirubin Urine: NEGATIVE
GLUCOSE, UA: NEGATIVE mg/dL
Hgb urine dipstick: NEGATIVE
Ketones, ur: NEGATIVE mg/dL
Leukocytes, UA: NEGATIVE
Nitrite: NEGATIVE
Protein, ur: NEGATIVE mg/dL
Specific Gravity, Urine: 1.014 (ref 1.005–1.030)
pH: 5 (ref 5.0–8.0)

## 2017-05-19 LAB — POCT PREGNANCY, URINE: PREG TEST UR: NEGATIVE

## 2017-05-19 MED ORDER — KETOROLAC TROMETHAMINE 30 MG/ML IJ SOLN
30.0000 mg | Freq: Once | INTRAMUSCULAR | Status: DC
  Filled 2017-05-19: qty 1

## 2017-05-19 MED ORDER — KETOROLAC TROMETHAMINE 30 MG/ML IJ SOLN
30.0000 mg | Freq: Once | INTRAMUSCULAR | Status: AC
  Administered 2017-05-19: 30 mg via INTRAMUSCULAR

## 2017-05-19 MED ORDER — METRONIDAZOLE 500 MG PO TABS: 500.0000 mg | ORAL_TABLET | Freq: Two times a day (BID) | ORAL | 0 refills | Status: DC

## 2017-05-19 NOTE — MAU Note (Addendum)
Have had lower abd and lower back pain for along time. Have been hosp due to pain. Given pain med and sent me on my way. 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 Pain is sharp and crampy, Constant pain but intensity varies. Nausea. Pink spotting with foul odor. Nio pain meds today but have tried Aleve and does not help

## 2017-05-19 NOTE — Discharge Instructions (Signed)
Bacterial Vaginosis Bacterial vaginosis is a vaginal infection that occurs when the normal balance of bacteria in the vagina is disrupted. It results from an overgrowth of certain bacteria. This is the most common vaginal infection among women ages 15-44. Because bacterial vaginosis increases your risk for STIs (sexually transmitted infections), getting treated can help reduce your risk for chlamydia, gonorrhea, herpes, and HIV (human immunodeficiency virus). Treatment is also important for preventing complications in pregnant women, because this condition can cause an early (premature) delivery. What are the causes? This condition is caused by an increase in harmful bacteria that are normally present in small amounts in the vagina. However, the reason that the condition develops is not fully understood. What increases the risk? The following factors may make you more likely to develop this condition:  Having a new sexual partner or multiple sexual partners.  Having unprotected sex.  Douching.  Having an intrauterine device (IUD).  Smoking.  Drug and alcohol abuse.  Taking certain antibiotic medicines.  Being pregnant.  You cannot get bacterial vaginosis from toilet seats, bedding, swimming pools, or contact with objects around you. What are the signs or symptoms? Symptoms of this condition include:  Grey or white vaginal discharge. The discharge can also be watery or foamy.  A fish-like odor with discharge, especially after sexual intercourse or during menstruation.  Itching in and around the vagina.  Burning or pain with urination.  Some women with bacterial vaginosis have no signs or symptoms. How is this diagnosed? This condition is diagnosed based on:  Your medical history.  A physical exam of the vagina.  Testing a sample of vaginal fluid under a microscope to look for a large amount of bad bacteria or abnormal cells. Your health care provider may use a cotton swab  or a small wooden spatula to collect the sample.  How is this treated? This condition is treated with antibiotics. These may be given as a pill, a vaginal cream, or a medicine that is put into the vagina (suppository). If the condition comes back after treatment, a second round of antibiotics may be needed. Follow these instructions at home: Medicines  Take over-the-counter and prescription medicines only as told by your health care provider.  Take or use your antibiotic as told by your health care provider. Do not stop taking or using the antibiotic even if you start to feel better. General instructions  If you have a female sexual partner, tell her that you have a vaginal infection. She should see her health care provider and be treated if she has symptoms. If you have a female sexual partner, he does not need treatment.  During treatment: ? Avoid sexual activity until you finish treatment. ? Do not douche. ? Avoid alcohol as directed by your health care provider. ? Avoid breastfeeding as directed by your health care provider.  Drink enough water and fluids to keep your urine clear or pale yellow.  Keep the area around your vagina and rectum clean. ? Wash the area daily with warm water. ? Wipe yourself from front to back after using the toilet.  Keep all follow-up visits as told by your health care provider. This is important. How is this prevented?  Do not douche.  Wash the outside of your vagina with warm water only.  Use protection when having sex. This includes latex condoms and dental dams.  Limit how many sexual partners you have. To help prevent bacterial vaginosis, it is best to have sex with just   one partner (monogamous).  Make sure you and your sexual partner are tested for STIs.  Wear cotton or cotton-lined underwear.  Avoid wearing tight pants and pantyhose, especially during summer.  Limit the amount of alcohol that you drink.  Do not use any products that  contain nicotine or tobacco, such as cigarettes and e-cigarettes. If you need help quitting, ask your health care provider.  Do not use illegal drugs. Where to find more information:  Centers for Disease Control and Prevention: www.cdc.gov/std  American Sexual Health Association (ASHA): www.ashastd.org  U.S. Department of Health and Human Services, Office on Women's Health: www.womenshealth.gov/ or https://www.womenshealth.gov/a-z-topics/bacterial-vaginosis Contact a health care provider if:  Your symptoms do not improve, even after treatment.  You have more discharge or pain when urinating.  You have a fever.  You have pain in your abdomen.  You have pain during sex.  You have vaginal bleeding between periods. Summary  Bacterial vaginosis is a vaginal infection that occurs when the normal balance of bacteria in the vagina is disrupted.  Because bacterial vaginosis increases your risk for STIs (sexually transmitted infections), getting treated can help reduce your risk for chlamydia, gonorrhea, herpes, and HIV (human immunodeficiency virus). Treatment is also important for preventing complications in pregnant women, because the condition can cause an early (premature) delivery.  This condition is treated with antibiotic medicines. These may be given as a pill, a vaginal cream, or a medicine that is put into the vagina (suppository). This information is not intended to replace advice given to you by your health care provider. Make sure you discuss any questions you have with your health care provider. Document Released: 09/18/2005 Document Revised: 06/03/2016 Document Reviewed: 06/03/2016 Elsevier Interactive Patient Education  2017 Elsevier Inc.  

## 2017-05-19 NOTE — Progress Notes (Signed)
Paulina Fusi CNM in to discuss test results and d/c plan with pt. Written and verbal d/c instructions given and understanding voiced.

## 2017-05-19 NOTE — MAU Provider Note (Signed)
Patient Jasmine Brewer is a 21 y.o. non-pregnant female here with abdominal pain. She has a long history of abdominal pain; she has tried birth control one time (OCP).  She cannot remember who provided her birth control; she has a Software engineer (she thinks) named Dr. Leavy Cella who she thinks is located in [facility].   Her diet today consisted of [employer] for breakfast at 11am, she has not had any lunch or dinner.  Yesterday she had Jamaica Toast, eggs, hash browns and sausage last night (at 1 am). She cannot remember what else she ate yesterday because of the pain.   She does have occasional nausea but no vomiting; she denies constipation or diarrhea.   She has a history of heavy, painful periods.  History     CSN: [id number]  Arrival date and time: 05/19/17 1916   First Provider Initiated Contact with Patient 05/19/17 2107      Chief Complaint  Patient presents with  . Abdominal Pain  . Back Pain   Abdominal Pain  This is a chronic problem. The current episode started in the past 7 days. The problem occurs intermittently. The pain is located in the suprapubic region. 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 The pain is severe. The quality of the pain is sharp and cramping. The abdominal pain radiates to the back. Exacerbated by: It is worse in the morning so she can't eat.   Vaginal Discharge  The patient's primary symptoms include vaginal bleeding. The patient's pertinent negatives include no vaginal discharge. The current episode started 1 to 4 weeks ago. Progression since onset: this pink discharge is typical for her; it is typical of her post-period discharge.  Associated symptoms include abdominal pain. The vaginal bleeding is spotting. She has not been passing clots. She has not been passing tissue.  She does not want a speculum exam today as her main complaint is her pain and not anything with her vagina.  She has cramps  before she even starts her period, then cramps after her period, and then extremely heavy and painful crampy periods.  OB History    Gravida Para Term Preterm AB Living   2       2 0   SAB TAB Ectopic Multiple Live Births   1 1            Past Medical History:  Diagnosis Date  . Medical history non-contributory     Past Surgical History:  Procedure Laterality Date  . INDUCED ABORTION      Family History  Problem Relation Age of Onset  . Alcohol abuse Neg Hx   . Arthritis Neg Hx   . Asthma Neg Hx   . Birth defects Neg Hx   . Cancer Neg Hx   . COPD Neg Hx   . Depression Neg Hx   . Diabetes Neg Hx   . Drug abuse Neg Hx   . Early death Neg Hx   . Hearing loss Neg Hx   . Heart disease Neg Hx   . Hyperlipidemia Neg Hx   . Hypertension Neg Hx   . Kidney disease Neg Hx   . Learning disabilities Neg Hx   . Mental illness Neg Hx   . Mental retardation Neg Hx   . Miscarriages / Stillbirths Neg Hx   . Stroke Neg Hx   . Vision loss Neg Hx   . Varicose Veins Neg  Hx     Social History  Substance Use Topics  . Smoking status: Current Some Day Smoker  . Smokeless tobacco: Never Used  . Alcohol use No    Allergies: No Known Allergies  Prescriptions Prior to Admission  Medication Sig Dispense Refill Last Dose  . naproxen sodium (ANAPROX) 220 MG tablet Take 220 mg by mouth 2 (two) times daily with a meal.   Past Month at Unknown time  . metroNIDAZOLE (FLAGYL) 500 MG tablet Take 1 tablet (500 mg total) by mouth 2 (two) times daily. 14 tablet 0     Review of Systems  Respiratory: Negative.   Cardiovascular: Negative.   Gastrointestinal: Positive for abdominal pain.  Genitourinary: Negative for vaginal discharge.  Musculoskeletal: Negative.    Physical Exam   Temperature 98.2 F (36.8 C), resp. rate 18, height 5\' 5"  (1.651 m), weight 182 lb (82.6 kg), last menstrual period 05/05/2017, unknown if currently breastfeeding.  Physical Exam  Constitutional: She is oriented  to person, place, and time.  HENT:  Head: Normocephalic and atraumatic.  Neck: Normal range of motion.  Respiratory: Effort normal.  GI: Soft. There is tenderness.  Tender on deep palpation directly above her pubic symphsis.   Genitourinary:  Genitourinary Comments: NEFG; no cmt or adnexal tenderness.   Musculoskeletal: Normal range of motion.  Neurological: She is alert and oriented to person, place, and time.  Skin: Skin is warm and dry.  Psychiatric: She has a normal mood and affect.    MAU Course  Procedures  MDM -wet prep: positive for clue -GC CT pending 30 mg of IM toradol given; patient rates her pain as a 0/10 now.  Encouraged patient to contact her previous provider about starting birth control for her possible dysmenorrhea; patient wants to be seen here at [facility].  Assessment and Plan   1. Pelvic pain   2. Bacterial vaginosis    2. RX for Flagyl given for BV.  3. Outpatient gyn [place] ordered; Message sent for WOC to call patient to schedule gyn visit with MD. 4. Encouraged patient to improve fiber/fruits and vegetables and reduce fat and grease in her diet in case her abdominal pain may be improved by a healthier diet.  Charlesetta Garibaldi Kooistra CNM 05/19/2017, 9:10 PM

## 2017-05-21 LAB — GC/CHLAMYDIA PROBE AMP (~~LOC~~) NOT AT ARMC
CHLAMYDIA, DNA PROBE: NEGATIVE
NEISSERIA GONORRHEA: NEGATIVE

## 2017-05-23 ENCOUNTER — Telehealth: Payer: Self-pay | Admitting: General Practice

## 2017-05-23 NOTE — Telephone Encounter (Signed)
Called and notified patient of appointment on 06/12/17 at 10:40am for [place] results and to discuss birth control.  Patient voiced understanding.

## 2017-06-06 ENCOUNTER — Ambulatory Visit (HOSPITAL_COMMUNITY): Payer: BLUE CROSS/BLUE SHIELD

## 2017-06-12 ENCOUNTER — Encounter: Payer: BLUE CROSS/BLUE SHIELD | Admitting: Family Medicine

## 2017-06-12 ENCOUNTER — Ambulatory Visit (HOSPITAL_COMMUNITY)
Admission: RE | Admit: 2017-06-12 | Discharge: 2017-06-12 | Disposition: A | Discharge status: Home / Self Care | Payer: BLUE CROSS/BLUE SHIELD | Source: Ambulatory Visit | Attending: Student | Admitting: Student

## 2017-06-12 DIAGNOSIS — R102 Pelvic and perineal pain: Secondary | ICD-10-CM | POA: Diagnosis not present

## 2017-07-03 ENCOUNTER — Encounter: Payer: BLUE CROSS/BLUE SHIELD | Admitting: Obstetrics and Gynecology

## 2017-07-03 ENCOUNTER — Encounter: Payer: Self-pay | Admitting: Obstetrics and Gynecology

## 2017-07-03 NOTE — Progress Notes (Signed)
Patient did not keep GYN appointment for 07/03/2017.  Dayrin Stallone, Jr MD Attending [facility] (Faculty Practice)   

## 2017-08-22 ENCOUNTER — Encounter: Payer: Self-pay | Admitting: General Practice

## 2017-08-27 ENCOUNTER — Encounter: Payer: Self-pay | Admitting: General Practice

## 2017-08-27 NOTE — Progress Notes (Signed)
Patient came by office requesting ultrasound results. Informed patient of ultrasound results. Patient verbalized understanding and asked why she was having pain. Told patient I wasn't certain but she could reschedule her missed appt if she would like to be seen. Patient verbalized understanding & had no questions

## 2017-10-19 DIAGNOSIS — R109 Unspecified abdominal pain: Secondary | ICD-10-CM | POA: Diagnosis not present

## 2017-10-19 DIAGNOSIS — N920 Excessive and frequent menstruation with regular cycle: Secondary | ICD-10-CM | POA: Diagnosis not present

## 2017-10-19 DIAGNOSIS — N946 Dysmenorrhea, unspecified: Secondary | ICD-10-CM | POA: Diagnosis not present

## 2017-11-14 DIAGNOSIS — Z6829 Body mass index (BMI) 29.0-29.9, adult: Secondary | ICD-10-CM | POA: Diagnosis not present

## 2017-11-14 DIAGNOSIS — N760 Acute vaginitis: Secondary | ICD-10-CM | POA: Diagnosis not present

## 2017-11-14 DIAGNOSIS — N946 Dysmenorrhea, unspecified: Secondary | ICD-10-CM | POA: Diagnosis not present

## 2018-04-06 ENCOUNTER — Emergency Department (HOSPITAL_COMMUNITY)
Admission: EM | Admit: 2018-04-06 | Discharge: 2018-04-06 | Disposition: A | Discharge status: Home / Self Care | Payer: BLUE CROSS/BLUE SHIELD | Attending: Emergency Medicine | Admitting: Emergency Medicine

## 2018-04-06 ENCOUNTER — Other Ambulatory Visit: Payer: Self-pay

## 2018-04-06 ENCOUNTER — Encounter (HOSPITAL_COMMUNITY): Payer: Self-pay | Admitting: Emergency Medicine

## 2018-04-06 DIAGNOSIS — F17200 Nicotine dependence, unspecified, uncomplicated: Secondary | ICD-10-CM | POA: Diagnosis not present

## 2018-04-06 DIAGNOSIS — L03011 Cellulitis of right finger: Secondary | ICD-10-CM | POA: Diagnosis not present

## 2018-04-06 DIAGNOSIS — Z79899 Other long term (current) drug therapy: Secondary | ICD-10-CM | POA: Diagnosis not present

## 2018-04-06 DIAGNOSIS — M79644 Pain in right finger(s): Secondary | ICD-10-CM | POA: Diagnosis not present

## 2018-04-06 MED ORDER — IBUPROFEN 600 MG PO TABS: 600.0000 mg | ORAL_TABLET | Freq: Four times a day (QID) | ORAL | 0 refills | Status: DC | PRN

## 2018-04-06 MED ORDER — SULFAMETHOXAZOLE-TRIMETHOPRIM 800-160 MG PO TABS: 1.0000 | ORAL_TABLET | Freq: Two times a day (BID) | ORAL | 0 refills | Status: AC

## 2018-04-06 MED ORDER — BUPIVACAINE HCL (PF) 0.5 % IJ SOLN
10.0000 mL | Freq: Once | INTRAMUSCULAR | Status: AC
  Administered 2018-04-06: 10 mL
  Filled 2018-04-06: qty 30

## 2018-04-06 NOTE — ED Provider Notes (Signed)
[facility]-EMERGENCY DEPT Provider Note   CSN: [id number] Arrival date & time: 04/06/18  7829     History   Chief Complaint Chief Complaint  Patient presents with  . Hand Pain    HPI Jasmine Brewer is a 22 y.o. female.  Pt presents to the ED today with right index finger pain.  Pt said it's been hurting for 1 week.  No injury.     Past Medical History:  Diagnosis Date  . Medical history non-contributory     There are no active problems to display for this patient.   Past Surgical History:  Procedure Laterality Date  . INDUCED ABORTION       OB History    Gravida  2   Para      Term      Preterm      AB  2   Living  0     SAB  1   TAB  1   Ectopic      Multiple      Live Births               Home Medications    Prior to Admission medications   Medication Sig Start Date End Date Taking? Authorizing Provider  ibuprofen (ADVIL,MOTRIN) 600 MG tablet Take 1 tablet (600 mg total) by mouth every 6 (six) hours as needed. 04/06/18   Jacalyn Lefevre, MD  metroNIDAZOLE (FLAGYL) 500 MG tablet Take 1 tablet (500 mg total) by mouth 2 (two) times daily. 05/19/17   Marylene Land, CNM  naproxen sodium (ANAPROX) 220 MG tablet Take 220 mg by mouth 2 (two) times daily with a meal.    [provider]  sulfamethoxazole-trimethoprim (BACTRIM DS,SEPTRA DS) 800-160 MG tablet Take 1 tablet by mouth 2 (two) times daily for 7 days. 04/06/18 04/13/18  Jacalyn Lefevre, MD    Family History Family History  Problem Relation Age of Onset  . Alcohol abuse Neg Hx   . Arthritis Neg Hx   . Asthma Neg Hx   . Birth defects Neg Hx   . Cancer Neg Hx   . COPD Neg Hx   . Depression Neg Hx   . Diabetes Neg Hx   . Drug abuse Neg Hx   . Early death Neg Hx   . Hearing loss Neg Hx   . Heart disease Neg Hx   . Hyperlipidemia Neg Hx   . Hypertension Neg Hx   . Kidney disease Neg Hx   . Learning disabilities Neg Hx   . Mental illness Neg  Hx   . Mental retardation Neg Hx   . Miscarriages / Stillbirths Neg Hx   . Stroke Neg Hx   . Vision loss Neg Hx   . Varicose Veins Neg Hx     Social History Social History   Tobacco Use  . Smoking status: Current Some Day Smoker  . Smokeless tobacco: Never Used  Substance Use Topics  . Alcohol use: No  . Drug use: No     Allergies   Patient has no known allergies.   Review of Systems Review of Systems  Musculoskeletal:       Right index finger pain/swelling  All other systems reviewed and are negative.    Physical Exam Updated Vital Signs BP 129/84 (BP Location: Left Arm)   Pulse 76   Temp 98.9 F (37.2 C) (Oral)   Resp 20   Ht 5\' 5"  (1.651 m)   Wt 81.6  kg (180 lb)   LMP 03/16/2018   SpO2 100%   BMI 29.95 kg/m   Physical Exam  Constitutional: She is oriented to person, place, and time. She appears well-developed and well-nourished.  HENT:  Head: Normocephalic and atraumatic.  Right Ear: External ear normal.  Left Ear: External ear normal.  Nose: Nose normal.  Mouth/Throat: Oropharynx is clear and moist.  Eyes: Pupils are equal, round, and reactive to light. Conjunctivae and EOM are normal.  Neck: Normal range of motion. Neck supple.  Cardiovascular: Normal rate, regular rhythm, normal heart sounds and intact distal pulses.  Pulmonary/Chest: Effort normal and breath sounds normal.  Abdominal: Soft. Bowel sounds are normal.  Musculoskeletal:  Paronychia right index finger  Neurological: She is alert and oriented to person, place, and time.  Skin: Skin is warm. Capillary refill takes less than 2 seconds.  Psychiatric: She has a normal mood and affect. Her behavior is normal. Judgment and thought content normal.  Nursing note and vitals reviewed.    ED Treatments / Results  Labs (all labs ordered are listed, but only abnormal results are displayed) Labs Reviewed - No data to display  EKG None  Radiology No results  found.  Procedures .Marland Kitchen.Incision and Drainage Date/Time: 04/06/2018 8:03 AM Performed by: Jacalyn LefevreHaviland, Tyiana Hill, MD Authorized by: Jacalyn LefevreHaviland, Rosanna Bickle, MD   Consent:    Consent obtained:  Verbal   Consent given by:  Patient   Risks discussed:  Incomplete drainage, bleeding and pain   Alternatives discussed:  No treatment Location:    Type:  Abscess   Size:  1   Location:  Upper extremity   Upper extremity location:  Finger   Finger location:  R index finger Pre-procedure details:    Skin preparation:  Betadine Anesthesia (see MAR for exact dosages):    Anesthesia method:  Nerve block   Block needle gauge:  25 G   Block anesthetic:  Bupivacaine 0.5% w/o epi   Block technique:  Digital   Block injection procedure:  Anatomic landmarks identified, introduced needle, incremental injection, negative aspiration for blood and anatomic landmarks palpated   Block outcome:  Anesthesia achieved Procedure type:    Complexity:  Simple Procedure details:    Incision types:  Single straight   Scalpel blade:  11   Drainage:  Purulent   Drainage amount:  Moderate   Packing materials:  None Post-procedure details:    Patient tolerance of procedure:  Tolerated well, no immediate complications   (including critical care time)  Medications Ordered in ED Medications  bupivacaine (MARCAINE) 0.5 % injection 10 mL (has no administration in time range)     Initial Impression / Assessment and Plan / ED Course  I have reviewed the triage vital signs and the nursing notes.  Pertinent labs & imaging results that were available during my care of the patient were reviewed by me and considered in my medical decision making (see chart for details).    Pt tolerated procedure well.  Dressing applied.  Pt is instructed to f/u with hand or return if sx worsen.  Final Clinical Impressions(s) / ED Diagnoses   Final diagnoses:  Paronychia of right index finger    ED Discharge Orders        Ordered     sulfamethoxazole-trimethoprim (BACTRIM DS,SEPTRA DS) 800-160 MG tablet  2 times daily     04/06/18 0801    ibuprofen (ADVIL,MOTRIN) 600 MG tablet  Every 6 hours PRN     04/06/18 0801  Jacalyn Lefevre, MD 04/06/18 [phone]

## 2018-04-06 NOTE — ED Triage Notes (Signed)
Patients right pointer finger is swollen. Patient states it has been like that for a week. Patient states that she did not injure it.

## 2018-08-06 DIAGNOSIS — H04301 Unspecified dacryocystitis of right lacrimal passage: Secondary | ICD-10-CM | POA: Diagnosis not present

## 2018-08-09 ENCOUNTER — Emergency Department (HOSPITAL_COMMUNITY): Payer: BLUE CROSS/BLUE SHIELD

## 2018-08-09 ENCOUNTER — Encounter (HOSPITAL_COMMUNITY): Payer: Self-pay

## 2018-08-09 ENCOUNTER — Emergency Department (HOSPITAL_COMMUNITY)
Admission: EM | Admit: 2018-08-09 | Discharge: 2018-08-09 | Disposition: A | Discharge status: Home / Self Care | Payer: BLUE CROSS/BLUE SHIELD | Attending: Emergency Medicine | Admitting: Emergency Medicine

## 2018-08-09 DIAGNOSIS — A599 Trichomoniasis, unspecified: Secondary | ICD-10-CM | POA: Insufficient documentation

## 2018-08-09 DIAGNOSIS — N730 Acute parametritis and pelvic cellulitis: Secondary | ICD-10-CM

## 2018-08-09 DIAGNOSIS — Z79899 Other long term (current) drug therapy: Secondary | ICD-10-CM | POA: Diagnosis not present

## 2018-08-09 DIAGNOSIS — F17200 Nicotine dependence, unspecified, uncomplicated: Secondary | ICD-10-CM | POA: Insufficient documentation

## 2018-08-09 DIAGNOSIS — N739 Female pelvic inflammatory disease, unspecified: Secondary | ICD-10-CM | POA: Insufficient documentation

## 2018-08-09 DIAGNOSIS — R102 Pelvic and perineal pain: Secondary | ICD-10-CM | POA: Insufficient documentation

## 2018-08-09 DIAGNOSIS — R1030 Lower abdominal pain, unspecified: Secondary | ICD-10-CM | POA: Diagnosis not present

## 2018-08-09 LAB — HIV ANTIBODY (ROUTINE TESTING W REFLEX): HIV Screen 4th Generation wRfx: NONREACTIVE

## 2018-08-09 LAB — COMPREHENSIVE METABOLIC PANEL
ALBUMIN: 4.3 g/dL (ref 3.5–5.0)
ALT: 18 U/L (ref 0–44)
AST: 20 U/L (ref 15–41)
Alkaline Phosphatase: 43 U/L (ref 38–126)
Anion gap: 9 (ref 5–15)
BUN: 11 mg/dL (ref 6–20)
CHLORIDE: 104 mmol/L (ref 98–111)
CO2: 25 mmol/L (ref 22–32)
Calcium: 9.2 mg/dL (ref 8.9–10.3)
Creatinine, Ser: 0.75 mg/dL (ref 0.44–1.00)
GFR calc Af Amer: >60 mL/min (ref >60)
GFR calc non Af Amer: >60 mL/min (ref >60)
GLUCOSE: 90 mg/dL (ref 70–99)
POTASSIUM: 3.8 mmol/L (ref 3.5–5.1)
SODIUM: 138 mmol/L (ref 135–145)
Total Bilirubin: 0.8 mg/dL (ref 0.3–1.2)
Total Protein: 7.9 g/dL (ref 6.5–8.1)

## 2018-08-09 LAB — URINALYSIS, ROUTINE W REFLEX MICROSCOPIC
BILIRUBIN URINE: NEGATIVE
Glucose, UA: NEGATIVE mg/dL
Hgb urine dipstick: NEGATIVE
Ketones, ur: 5 mg/dL — AB
Nitrite: NEGATIVE
PH: 5 (ref 5.0–8.0)
Protein, ur: NEGATIVE mg/dL
SPECIFIC GRAVITY, URINE: 1.034 — AB (ref 1.005–1.030)

## 2018-08-09 LAB — WET PREP, GENITAL
Clue Cells Wet Prep HPF POC: NONE SEEN
SPERM: NONE SEEN
Yeast Wet Prep HPF POC: NONE SEEN

## 2018-08-09 LAB — CBC WITH DIFFERENTIAL/PLATELET
ABS IMMATURE GRANULOCYTES: 0.01 10*3/uL (ref 0.00–0.07)
BASOS ABS: 0 10*3/uL (ref 0.0–0.1)
Basophils Relative: 1 %
EOS ABS: 0.5 10*3/uL (ref 0.0–0.5)
Eosinophils Relative: 7 %
HEMATOCRIT: 41.7 % (ref 36.0–46.0)
HEMOGLOBIN: 13.4 g/dL (ref 12.0–15.0)
IMMATURE GRANULOCYTES: 0 %
LYMPHS ABS: 2.9 10*3/uL (ref 0.7–4.0)
LYMPHS PCT: 44 %
MCH: 29.3 pg (ref 26.0–34.0)
MCHC: 32.1 g/dL (ref 30.0–36.0)
MCV: 91.2 fL (ref 80.0–100.0)
MONOS PCT: 6 %
Monocytes Absolute: 0.4 10*3/uL (ref 0.1–1.0)
NEUTROS ABS: 2.7 10*3/uL (ref 1.7–7.7)
NEUTROS PCT: 42 %
NRBC: 0 % (ref 0.0–0.2)
Platelets: 375 10*3/uL (ref 150–400)
RBC: 4.57 MIL/uL (ref 3.87–5.11)
RDW: 13.8 % (ref 11.5–15.5)
WBC: 6.6 10*3/uL (ref 4.0–10.5)

## 2018-08-09 LAB — I-STAT BETA HCG BLOOD, ED (MC, WL, AP ONLY)

## 2018-08-09 LAB — RPR: RPR: NONREACTIVE

## 2018-08-09 MED ORDER — DOXYCYCLINE HYCLATE 100 MG PO TABS
100.0000 mg | ORAL_TABLET | Freq: Once | ORAL | Status: AC
  Administered 2018-08-09: 100 mg via ORAL
  Filled 2018-08-09: qty 1

## 2018-08-09 MED ORDER — CEFTRIAXONE SODIUM 250 MG IJ SOLR
250.0000 mg | Freq: Once | INTRAMUSCULAR | Status: AC
  Administered 2018-08-09: 250 mg via INTRAMUSCULAR
  Filled 2018-08-09: qty 250

## 2018-08-09 MED ORDER — NAPROXEN 375 MG PO TABS: 375.0000 mg | ORAL_TABLET | Freq: Two times a day (BID) | ORAL | 0 refills | Status: DC

## 2018-08-09 MED ORDER — DOXYCYCLINE HYCLATE 100 MG PO CAPS: 100.0000 mg | ORAL_CAPSULE | Freq: Two times a day (BID) | ORAL | 0 refills | Status: AC

## 2018-08-09 MED ORDER — LIDOCAINE HCL 2 % IJ SOLN
INTRAMUSCULAR | Status: AC
  Administered 2018-08-09: 11:00:00
  Filled 2018-08-09: qty 20

## 2018-08-09 MED ORDER — METRONIDAZOLE 500 MG PO TABS
2000.0000 mg | ORAL_TABLET | Freq: Once | ORAL | Status: AC
  Administered 2018-08-09: 2000 mg via ORAL
  Filled 2018-08-09: qty 4

## 2018-08-09 NOTE — ED Provider Notes (Signed)
[facility]-EMERGENCY DEPT Provider Note   CSN: [id number] Arrival date & time: 08/09/18  0645     History   Chief Complaint No chief complaint on file.   HPI Jasmine Brewer is a 22 y.o. female.  HPI  Patient is a 22 year old female with no significant past medical history presenting for lower abdominal pain.  She reports that is been occurring for 4 years.  Patient reports that this morning, or prompted her visit to the emergency department was that the pain woke her up.  Visit was prompted by her partner who was concerned about the pain this morning. Patient reports it is the same pain she has experienced for 4 years. She reports that it occurs almost daily, and is constant.  She describes the pain as sharp.  She reports it is nonfocal and radiates across the lower abdomen.  She reports that nothing makes it better or worse.  Not affected by eating.  She denies dyspareunia.  She denies nausea, vomiting, or diarrhea.  Reports regular bowel movements without constipation.  Denies dysuria, urgency, or frequency.  She reports some slight increased vaginal discharge, but does report that she is on antibiotics for eye infection.  She denies vaginal bleeding when not on menses.  She denies postcoital bleeding.  Patient denies fever or chills.  Patient reports LMP was 07-28-2018, and it was irregular compared to prior periods.  Patient reports she is sexually active with one female partner, does have unprotected sex.  Patient denies concern about STI exposure.  Patient reports she has had previous evaluations in the emergency department in [facility] health emergency department, and had an outpatient OB/GYN appointment, however it is been greater than 1 year since she has been seen.  Past Medical History:  Diagnosis Date  . Medical history non-contributory     There are no active problems to display for this patient.   Past Surgical History:  Procedure Laterality Date  .  INDUCED ABORTION       OB History    Gravida  2   Para      Term      Preterm      AB  2   Living  0     SAB  1   TAB  1   Ectopic      Multiple      Live Births               Home Medications    Prior to Admission medications   Medication Sig Start Date End Date Taking? Authorizing Provider  ibuprofen (ADVIL,MOTRIN) 600 MG tablet Take 1 tablet (600 mg total) by mouth every 6 (six) hours as needed. 04/06/18   Jacalyn Lefevre, MD  metroNIDAZOLE (FLAGYL) 500 MG tablet Take 1 tablet (500 mg total) by mouth 2 (two) times daily. 05/19/17   Marylene Land, CNM  naproxen sodium (ANAPROX) 220 MG tablet Take 220 mg by mouth 2 (two) times daily with a meal.    [provider]    Family History Family History  Problem Relation Age of Onset  . Alcohol abuse Neg Hx   . Arthritis Neg Hx   . Asthma Neg Hx   . Birth defects Neg Hx   . Cancer Neg Hx   . COPD Neg Hx   . Depression Neg Hx   . Diabetes Neg Hx   . Drug abuse Neg Hx   . Early death Neg Hx   . Hearing loss  Neg Hx   . Heart disease Neg Hx   . Hyperlipidemia Neg Hx   . Hypertension Neg Hx   . Kidney disease Neg Hx   . Learning disabilities Neg Hx   . Mental illness Neg Hx   . Mental retardation Neg Hx   . Miscarriages / Stillbirths Neg Hx   . Stroke Neg Hx   . Vision loss Neg Hx   . Varicose Veins Neg Hx     Social History Social History   Tobacco Use  . Smoking status: Current Some Day Smoker  . Smokeless tobacco: Never Used  Substance Use Topics  . Alcohol use: No  . Drug use: No     Allergies   Patient has no known allergies.   Review of Systems Review of Systems  Constitutional: Negative for chills and fever.  HENT: Negative for congestion and sinus pain.   Eyes: Negative for visual disturbance.  Respiratory: Negative for cough.   Cardiovascular: Negative for chest pain.  Gastrointestinal: Positive for abdominal pain. Negative for constipation, diarrhea, nausea  and vomiting.  Genitourinary: Positive for pelvic pain and vaginal discharge. Negative for dysuria, flank pain, urgency, vaginal bleeding and vaginal pain.  Musculoskeletal: Negative for back pain and myalgias.  Skin: Negative for rash.  Neurological: Negative for dizziness and light-headedness.     Physical Exam Updated Vital Signs BP 132/88 (BP Location: Left Arm)   Pulse 90   Temp 98.6 F (37 C) (Oral)   Resp 20   LMP 07/28/2018   SpO2 100%   Physical Exam  Constitutional: She appears well-developed and well-nourished. No distress.  HENT:  Head: Normocephalic and atraumatic.  Mouth/Throat: Oropharynx is clear and moist.  Eyes: Pupils are equal, round, and reactive to light. Conjunctivae and EOM are normal.  Neck: Normal range of motion. Neck supple.  Cardiovascular: Normal rate, regular rhythm, S1 normal and S2 normal.  No murmur heard. Pulmonary/Chest: Effort normal and breath sounds normal. She has no wheezes. She has no rales.  Abdominal: Soft. Bowel sounds are normal. She exhibits no distension. There is tenderness. There is no guarding.  Mild tenderness palpation of the lower abdomen without focal nature.  Genitourinary:  Genitourinary Comments: Pelvic examination performed with RN chaperone present.  Patient has single horizontal chain lymphadenopathy.  No vertical chain lymphadenopathy.  No other lesions of the vagina or perineum.  Normal, rugated pink vaginal tissue.  Cervix not erythematous and nonfriable.  Moderate amount of milky discharge surrounding cervical os.  On bimanual exam, patient has no cervical motion tenderness, tenderness to uterine fundus, nor adnexal tenderness.  Musculoskeletal: Normal range of motion. She exhibits no edema or deformity.  Neurological: She is alert.  Cranial nerves grossly intact. Patient moves extremities symmetrically and with good coordination.  Skin: Skin is warm and dry. No rash noted. No erythema.  Psychiatric: She has a  normal mood and affect. Her behavior is normal. Judgment and thought content normal.  Nursing note and vitals reviewed.    ED Treatments / Results  Labs (all labs ordered are listed, but only abnormal results are displayed) Labs Reviewed  URINALYSIS, ROUTINE W REFLEX MICROSCOPIC - Abnormal; Notable for the following components:      Result Value   APPearance HAZY (*)    Specific Gravity, Urine 1.034 (*)    Ketones, ur 5 (*)    Leukocytes, UA LARGE (*)    Bacteria, UA FEW (*)    All other components within normal limits  WET PREP, GENITAL  CBC WITH DIFFERENTIAL/PLATELET  COMPREHENSIVE METABOLIC PANEL  RPR  HIV ANTIBODY (ROUTINE TESTING W REFLEX)  I-STAT BETA HCG BLOOD, ED ([facility], [facility], [facility] ONLY)  GC/CHLAMYDIA PROBE AMP ([place]) NOT AT [facility]    EKG None  Radiology No results found.  Procedures Procedures (including critical care time)  Medications Ordered in ED Medications - No data to display   Initial Impression / Assessment and Plan / ED Course  I have reviewed the triage vital signs and the nursing notes.  Pertinent labs & imaging results that were available during my care of the patient were reviewed by me and considered in my medical decision making (see chart for details).  Clinical Course as of Aug 09 1128  Fri Aug 09, 2018  1610 Robynn Pane Prep(!): PRESENT [AM]  [phone] Will treat for PID with report of pelvic pain.  WBC, Wet Prep HPF POC(!): MANY [AM]  0954 Likely 2/2 STI. Pt is not having urinary symptoms.   Leukocytes, UA(!): LARGE [AM]    Clinical Course User Index [AM] Elisha Ponder, PA-C    Patient nontoxic-appearing, afebrile, and with nonsurgical abdomen.  Differential diagnosis includes cystitis, PID, cervicitis, endometriosis, cystitis, interstitial cystitis, chronic pelvic pain, pelvic floor dysfunction.  Pelvic exam demonstrating copious vaginal fluid concerning for STI.  Wet prep with trichomonas and many WBCs.  Given the increase above  patient baseline pelvic pain, will treat for pelvic inflammatory disease.  Patient instructed on this condition, and given instructions not to have unprotected sexual intercourse for 7 days.  Instructed to notify partner to be tested and treated.  No other laboratory abnormalities.  Patient is afebrile, without leukocytosis, and tolerating p.o. without difficulty.  Secondary issue today, patient noted that she has been on clindamycin for 5 days now due to erythema and swelling of the right lacrimal gland.  This appears to have resolved, patient does have slight thickening remaining in the lacrimal gland.  Suspect this is a chronic nasal duct obstruction.  The cellulitic appearance is completely resolved.  Patient instructed to proceed on with doxycycline and stop clindamycin.  Return precautions were given for any increasing pain, intractable nausea or vomiting, or fevers with pain.  OB/GYN referral information given.  Patient is in understanding and agrees with the plan of care.  This is a supervised visit with Dr. Samuel Jester. Evaluation, management, and discharge planning discussed with this attending physician.  Final Clinical Impressions(s) / ED Diagnoses   Final diagnoses:  Pelvic pain  PID (acute pelvic inflammatory disease)  Trichomonas infection    ED Discharge Orders    None       Elisha Ponder, PA-C 08/09/18 1131    Samuel Jester, [place] 08/10/18 [phone]

## 2018-08-09 NOTE — ED Triage Notes (Signed)
Pt complains of lower abdominal pain for a month, she's been seen for the same and noone can tell her what's wrong, pt is very tearful  Pt states her last period was 10 days late, she senies and diarrhea or vomiting and states the pain woke her up this am

## 2018-08-09 NOTE — ED Notes (Signed)
Bed: WA06 Expected date:  Expected time:  Means of arrival:  Comments: 

## 2018-08-09 NOTE — ED Notes (Signed)
Bed: WA05 Expected date:  Expected time:  Means of arrival:  Comments: 

## 2018-08-09 NOTE — ED Notes (Signed)
Ultrasound at bedside

## 2018-08-09 NOTE — Discharge Instructions (Addendum)
You have been treated with one-time dose for gonorrhea today.  You have been treated with a one-time dose for trichomonas today.  You will need to take doxycycline twice a day for the next 10 days.  Flagyl is an antibiotic used to treat trichomonas. This medication CANNOT be taken with alcohol, because it can cause nausea and vomiting combined with alcohol. Please also refrain from drinking alcohol for 48 hours after you finish the medicine.  Doxycycline is an antibiotic that fights infection in the pelvis. This medication can make your skin sensitive to the sun, so please ensure that you wear sunscreen, hats, or other coverage over your skin while taking this. This medicine CANNOT be taken by women while pregnant, breastfeeding, or trying to become pregnant.  Please speak with a healthcare provider if any of these situations apply to you.  You may take naproxen (Aleve) and Tylenol for pain. Do not exceed 4000mg  of Tylenol in one day.  Use a condom with every sexual encounter Follow up with your OBGYN in regards to today's visit.  I placed the number for the women's clinic again on your paperwork. Please return to the ER for worsening symptoms, high fevers or persistent vomiting.  You have been tested for HIV, syphilis, chlamydia and gonorrhea. These results will be available in approximately 3 days. You will be notified if they are positive.   It is very important to practice safe sex and use condoms when sexually active. If your results are positive you need to notify all sexual partners so they can be treated as well. The website https://garcia.net/ can be used to send anonymous text messages or emails to alert sexual contacts.   Please do not engage in any unprotected sexual intercourse for the next 7 days.  Please have your partner be tested and treated.  SEEK IMMEDIATE MEDICAL CARE IF:  You develop an oral temperature above 102 F (38.9 C), not controlled by medications or lasting  more than 2 days.  You develop an increase in pain.  You develop vaginal bleeding and it is not time for your period.  You develop painful intercourse.

## 2018-08-12 LAB — GC/CHLAMYDIA PROBE AMP (~~LOC~~) NOT AT ARMC
Chlamydia: NEGATIVE
Neisseria Gonorrhea: NEGATIVE

## 2018-09-20 ENCOUNTER — Encounter: Payer: Self-pay | Admitting: Obstetrics & Gynecology

## 2018-09-20 ENCOUNTER — Ambulatory Visit (INDEPENDENT_AMBULATORY_CARE_PROVIDER_SITE_OTHER): Payer: BLUE CROSS/BLUE SHIELD | Admitting: Obstetrics & Gynecology

## 2018-09-20 DIAGNOSIS — E6609 Other obesity due to excess calories: Secondary | ICD-10-CM | POA: Diagnosis not present

## 2018-09-20 DIAGNOSIS — E669 Obesity, unspecified: Secondary | ICD-10-CM | POA: Insufficient documentation

## 2018-09-20 DIAGNOSIS — Z6831 Body mass index (BMI) 31.0-31.9, adult: Secondary | ICD-10-CM | POA: Diagnosis not present

## 2018-09-20 MED ORDER — NORGESTREL-ETHINYL ESTRADIOL 0.3-30 MG-MCG PO TABS: ORAL_TABLET | ORAL | 7 refills | Status: DC

## 2018-09-20 NOTE — Progress Notes (Signed)
Patient ID: Jasmine BarefootDanesha Gohlke, female   DOB: 1996-09-18, 22 y.o.   MRN: [id number]  Chief Complaint  Patient presents with  . Abdominal Pain    HPI Jasmine BarefootDanesha Hambly is a 22 y.o. single P0 here with the issue of 4 year h/o lower pelvic pain. This sometimes daily, a 20 out of 10. It is not worse with her periods. Some dyspareunia. Nothing specific makes it worse. Tramadol with Aleve helps. But it will come back the next day. Her aunt has endometriosis. The pain radiates to her lower back.  She has unprotected IC for about 6 months. She is wanting a pregnancy but she is not taking her PNVs that she has at home (She plans to start taking them after our conversation about ONTDs)  Past Medical History:  Diagnosis Date  . Anxiety   . Medical history non-contributory     Past Surgical History:  Procedure Laterality Date  . INDUCED ABORTION    . WISDOM TOOTH EXTRACTION      Family History  Problem Relation Age of Onset  . Alcohol abuse Neg Hx   . Arthritis Neg Hx   . Asthma Neg Hx   . Birth defects Neg Hx   . Cancer Neg Hx   . COPD Neg Hx   . Depression Neg Hx   . Diabetes Neg Hx   . Drug abuse Neg Hx   . Early death Neg Hx   . Hearing loss Neg Hx   . Heart disease Neg Hx   . Hyperlipidemia Neg Hx   . Hypertension Neg Hx   . Kidney disease Neg Hx   . Learning disabilities Neg Hx   . Mental illness Neg Hx   . Mental retardation Neg Hx   . Miscarriages / Stillbirths Neg Hx   . Stroke Neg Hx   . Vision loss Neg Hx   . Varicose Veins Neg Hx     Social History Social History   Tobacco Use  . Smoking status: Current Some Day Smoker  . Smokeless tobacco: Never Used  Substance Use Topics  . Alcohol use: Yes    Comment: occasionally  . Drug use: Yes    Types: Marijuana    Allergies  Allergen Reactions  . Shellfish Allergy Itching and Nausea Only    Current Outpatient Medications  Medication Sig Dispense Refill  . acetaminophen (TYLENOL) 500 MG tablet Take 1,000 mg by mouth  every 6 (six) hours as needed for mild pain.     No current facility-administered medications for this visit.     Review of Systems Review of Systems Her last pap was 2/19 in [employer]- didn't finish due to the running test Works with special needs people  Blood pressure 124/71, pulse 82, height 5\' 5"  (1.651 m), weight 193 lb (87.5 kg), last menstrual period 09/01/2018, unknown if currently breastfeeding.  Physical Exam Physical Exam Heart- rrr Lungs- CTAB Abd- benign normal size and shape, anteverted, mobile, non-tender, normal adnexal exam   Data Reviewed U/s review ER notes reviewed  Assessment    Chronic pelvic pain Desire for pregnancy    Plan    I have asked her to clarifly her priority: fix pain with OCPs or continue to try for a pregnancy She opts for treatment of pain I will prescribe continuous Lo ovral       Oberon Hehir C Osby Sweetin 09/20/2018, 9:22 AM

## 2018-10-20 ENCOUNTER — Emergency Department (HOSPITAL_COMMUNITY)
Admission: EM | Admit: 2018-10-20 | Discharge: 2018-10-21 | Disposition: A | Discharge status: Home / Self Care | Payer: BLUE CROSS/BLUE SHIELD | Attending: Emergency Medicine | Admitting: Emergency Medicine

## 2018-10-20 ENCOUNTER — Other Ambulatory Visit: Payer: Self-pay

## 2018-10-20 ENCOUNTER — Encounter (HOSPITAL_COMMUNITY): Payer: Self-pay

## 2018-10-20 DIAGNOSIS — F17200 Nicotine dependence, unspecified, uncomplicated: Secondary | ICD-10-CM | POA: Insufficient documentation

## 2018-10-20 DIAGNOSIS — R42 Dizziness and giddiness: Secondary | ICD-10-CM

## 2018-10-20 DIAGNOSIS — R112 Nausea with vomiting, unspecified: Secondary | ICD-10-CM | POA: Diagnosis not present

## 2018-10-20 DIAGNOSIS — R0602 Shortness of breath: Secondary | ICD-10-CM | POA: Insufficient documentation

## 2018-10-20 DIAGNOSIS — Z79899 Other long term (current) drug therapy: Secondary | ICD-10-CM | POA: Diagnosis not present

## 2018-10-20 DIAGNOSIS — R7989 Other specified abnormal findings of blood chemistry: Secondary | ICD-10-CM | POA: Diagnosis not present

## 2018-10-20 LAB — CBC
HCT: 41.7 % (ref 36.0–46.0)
Hemoglobin: 13.4 g/dL (ref 12.0–15.0)
MCH: 29.6 pg (ref 26.0–34.0)
MCHC: 32.1 g/dL (ref 30.0–36.0)
MCV: 92.1 fL (ref 80.0–100.0)
NRBC: 0 % (ref 0.0–0.2)
PLATELETS: 403 10*3/uL — AB (ref 150–400)
RBC: 4.53 MIL/uL (ref 3.87–5.11)
RDW: 15.1 % (ref 11.5–15.5)
WBC: 7.9 10*3/uL (ref 4.0–10.5)

## 2018-10-20 LAB — URINALYSIS, ROUTINE W REFLEX MICROSCOPIC
Bilirubin Urine: NEGATIVE
GLUCOSE, UA: NEGATIVE mg/dL
HGB URINE DIPSTICK: NEGATIVE
KETONES UR: 5 mg/dL — AB
Leukocytes, UA: NEGATIVE
Nitrite: NEGATIVE
PROTEIN: NEGATIVE mg/dL
Specific Gravity, Urine: 1.026 (ref 1.005–1.030)
pH: 6 (ref 5.0–8.0)

## 2018-10-20 LAB — I-STAT BETA HCG BLOOD, ED (MC, WL, AP ONLY)

## 2018-10-20 LAB — BASIC METABOLIC PANEL
ANION GAP: 10 (ref 5–15)
BUN: 11 mg/dL (ref 6–20)
CALCIUM: 9.5 mg/dL (ref 8.9–10.3)
CO2: 22 mmol/L (ref 22–32)
CREATININE: 0.75 mg/dL (ref 0.44–1.00)
Chloride: 106 mmol/L (ref 98–111)
GFR calc non Af Amer: >60 mL/min (ref >60)
GLUCOSE: 88 mg/dL (ref 70–99)
Potassium: 3.6 mmol/L (ref 3.5–5.1)
Sodium: 138 mmol/L (ref 135–145)

## 2018-10-20 MED ORDER — MECLIZINE HCL 25 MG PO TABS
50.0000 mg | ORAL_TABLET | Freq: Once | ORAL | Status: AC
  Administered 2018-10-20: 50 mg via ORAL
  Filled 2018-10-20: qty 2

## 2018-10-20 MED ORDER — ONDANSETRON HCL 4 MG/2ML IJ SOLN
4.0000 mg | Freq: Once | INTRAMUSCULAR | Status: AC
  Administered 2018-10-20: 4 mg via INTRAVENOUS
  Filled 2018-10-20: qty 2

## 2018-10-20 MED ORDER — SODIUM CHLORIDE 0.9 % IV BOLUS
1000.0000 mL | Freq: Once | INTRAVENOUS | Status: AC
  Administered 2018-10-20: 1000 mL via INTRAVENOUS

## 2018-10-20 MED ORDER — SODIUM CHLORIDE 0.9% FLUSH
3.0000 mL | Freq: Once | INTRAVENOUS | Status: AC
  Administered 2018-10-20: 3 mL via INTRAVENOUS

## 2018-10-20 NOTE — ED Notes (Signed)
Pt had episode of vomiting while Clinical research associate at bedside. Will notify RN

## 2018-10-20 NOTE — ED Notes (Signed)
Urine culture in lab if needed.  

## 2018-10-20 NOTE — ED Notes (Signed)
Pt ambulated to BR, complaining of "a little bit of dizziness".

## 2018-10-20 NOTE — ED Triage Notes (Signed)
States since last night c/o dizziness and nausea feels like room is going round and round with vomiting. Never felt like this before.

## 2018-10-20 NOTE — ED Provider Notes (Signed)
[facility]-EMERGENCY DEPT Provider Note   CSN: [id number] Arrival date & time: 10/20/18  1956     History   Chief Complaint Chief Complaint  Patient presents with  . Dizziness    nausea    HPI Jasmine BarefootDanesha Brewer is a 23 y.o. female.  Patient with no significant medical history presents with sudden onset dizziness around 11:30 pm last night (10/19/18). She describes the dizziness as surroundings spinning, worse with position change and associated with nausea and vomiting. She reports mild SOB but denies pleuritic pain. No fever. No current or recent URI symptoms of congestion, cough, sinus pressure. No headache, visual changes, urinary symptoms. She is eating and drinking per her usual. She started oral contraceptives nearly one month ago, otherwise, she takes no regular medications.   The history is provided by the patient. No language interpreter was used.  Dizziness  Associated symptoms: nausea, shortness of breath and vomiting   Associated symptoms: no chest pain     Past Medical History:  Diagnosis Date  . Anxiety   . Medical history non-contributory     Patient Active Problem List   Diagnosis Date Noted  . Obesity 09/20/2018    Past Surgical History:  Procedure Laterality Date  . INDUCED ABORTION    . WISDOM TOOTH EXTRACTION       OB History    Gravida  2   Para      Term      Preterm      AB  2   Living  0     SAB  1   TAB  1   Ectopic      Multiple      Live Births               Home Medications    Prior to Admission medications   Medication Sig Start Date End Date Taking? Authorizing Provider  acetaminophen (TYLENOL) 500 MG tablet Take 1,000 mg by mouth every 6 (six) hours as needed for mild pain.    [provider]  norgestrel-ethinyl estradiol (LO/OVRAL,CRYSELLE) 0.3-30 MG-MCG tablet Take an active pill daily, she will need to pick up her prescription 3 weeks early 09/20/18   Allie Bossierove, Myra C, MD     Family History Family History  Problem Relation Age of Onset  . Alcohol abuse Neg Hx   . Arthritis Neg Hx   . Asthma Neg Hx   . Birth defects Neg Hx   . Cancer Neg Hx   . COPD Neg Hx   . Depression Neg Hx   . Diabetes Neg Hx   . Drug abuse Neg Hx   . Early death Neg Hx   . Hearing loss Neg Hx   . Heart disease Neg Hx   . Hyperlipidemia Neg Hx   . Hypertension Neg Hx   . Kidney disease Neg Hx   . Learning disabilities Neg Hx   . Mental illness Neg Hx   . Mental retardation Neg Hx   . Miscarriages / Stillbirths Neg Hx   . Stroke Neg Hx   . Vision loss Neg Hx   . Varicose Veins Neg Hx     Social History Social History   Tobacco Use  . Smoking status: Current Some Day Smoker  . Smokeless tobacco: Never Used  Substance Use Topics  . Alcohol use: Yes    Comment: occasionally  . Drug use: Yes    Types: Marijuana     Allergies  Shellfish allergy   Review of Systems Review of Systems  Constitutional: Negative for chills and fever.  HENT: Negative.  Negative for congestion, ear pain and sinus pressure.   Eyes: Negative for visual disturbance.  Respiratory: Positive for shortness of breath. Negative for cough.   Cardiovascular: Negative.  Negative for chest pain.  Gastrointestinal: Positive for nausea and vomiting. Negative for abdominal pain.  Musculoskeletal: Negative.        No LE pain or swelling.  Skin: Negative.   Neurological: Positive for dizziness.     Physical Exam Updated Vital Signs BP 128/89 (BP Location: Left Arm)   Pulse 75   Temp 98.4 F (36.9 C) (Oral)   Resp 14   Ht 5\' 5"  (1.651 m)   Wt 86.2 kg   SpO2 100%   BMI 31.62 kg/m   Physical Exam Constitutional:      Appearance: She is well-developed.  HENT:     Head: Normocephalic.  Eyes:     Conjunctiva/sclera: Conjunctivae normal.     Pupils: Pupils are equal, round, and reactive to light.  Neck:     Musculoskeletal: Normal range of motion and neck supple.  Cardiovascular:      Rate and Rhythm: Normal rate and regular rhythm.     Heart sounds: No murmur.  Pulmonary:     Effort: Pulmonary effort is normal.     Breath sounds: Normal breath sounds. No wheezing, rhonchi or rales.  Abdominal:     General: Bowel sounds are normal.     Palpations: Abdomen is soft.     Tenderness: There is no abdominal tenderness. There is no guarding or rebound.  Musculoskeletal: Normal range of motion.  Skin:    General: Skin is warm and dry.  Neurological:     General: No focal deficit present.     Mental Status: She is alert and oriented to person, place, and time.     Sensory: No sensory deficit.     Coordination: Coordination normal.     Gait: Gait normal.     Comments: No appreciable nystagmus..      ED Treatments / Results  Labs (all labs ordered are listed, but only abnormal results are displayed) Labs Reviewed  CBC - Abnormal; Notable for the following components:      Result Value   Platelets 403 (*)    All other components within normal limits  BASIC METABOLIC PANEL  URINALYSIS, ROUTINE W REFLEX MICROSCOPIC  D-DIMER, QUANTITATIVE (NOT AT [facility])  I-STAT BETA HCG BLOOD, ED ([facility], [facility], [facility] ONLY)    EKG None  Radiology No results found.  Procedures Procedures (including critical care time)  Medications Ordered in ED Medications  sodium chloride flush (NS) 0.9 % injection 3 mL (has no administration in time range)  sodium chloride 0.9 % bolus 1,000 mL (has no administration in time range)  ondansetron (ZOFRAN) injection 4 mg (has no administration in time range)  meclizine (ANTIVERT) tablet 50 mg (has no administration in time range)     Initial Impression / Assessment and Plan / ED Course  I have reviewed the triage vital signs and the nursing notes.  Pertinent labs & imaging results that were available during my care of the patient were reviewed by me and considered in my medical decision making (see chart for details).     Patient to ED with  room-spinning dizziness associated with nausea and vomiting x 24 hours. Mild SOB without cough, fever, or congestion. Start oral contraceptives one month  ago - doubts pregnancy.   Symptoms c/w vertigo. Negative pregnancy test tonight.D-dimer added due to symptoms and recent OCP's. Consider risk of PE low, however, cannot apply PERC criteria. IVF's fluids ordered, Zofran, Meclizine. Will observe and reassess.   On re-evaluation, the patient's nausea has resolved and there is improvement in her dizziness after meclizine. Her d-dimer was elevated but subsequent CTA study is negative for PE.   She can be discharged home with Meclizine and Zofran for home use. Return precautions discussed.   Final Clinical Impressions(s) / ED Diagnoses   Final diagnoses:  None   1. Vertigo  ED Discharge Orders    None       Elpidio Anis, Cordelia Poche 10/21/18 0114    Maia Plan, MD 10/21/18 [phone]

## 2018-10-21 ENCOUNTER — Emergency Department (HOSPITAL_COMMUNITY): Payer: BLUE CROSS/BLUE SHIELD

## 2018-10-21 ENCOUNTER — Encounter (HOSPITAL_COMMUNITY): Payer: Self-pay

## 2018-10-21 DIAGNOSIS — R7989 Other specified abnormal findings of blood chemistry: Secondary | ICD-10-CM | POA: Diagnosis not present

## 2018-10-21 LAB — D-DIMER, QUANTITATIVE (NOT AT ARMC): D DIMER QUANT: 0.77 ug{FEU}/mL — AB (ref 0.00–0.50)

## 2018-10-21 MED ORDER — SODIUM CHLORIDE (PF) 0.9 % IJ SOLN
INTRAMUSCULAR | Status: AC
  Filled 2018-10-21: qty 50

## 2018-10-21 MED ORDER — IOPAMIDOL (ISOVUE-370) INJECTION 76%
INTRAVENOUS | Status: AC
  Filled 2018-10-21: qty 100

## 2018-10-21 MED ORDER — ONDANSETRON 4 MG PO TBDP: 4.0000 mg | ORAL_TABLET | Freq: Three times a day (TID) | ORAL | 0 refills | Status: DC | PRN

## 2018-10-21 MED ORDER — MECLIZINE HCL 25 MG PO TABS: 25.0000 mg | ORAL_TABLET | Freq: Three times a day (TID) | ORAL | 0 refills | Status: DC | PRN

## 2018-10-21 MED ORDER — METOCLOPRAMIDE HCL 10 MG PO TABS
10.0000 mg | ORAL_TABLET | Freq: Once | ORAL | Status: AC
  Administered 2018-10-21: 10 mg via ORAL
  Filled 2018-10-21: qty 1

## 2018-10-21 MED ORDER — IOPAMIDOL (ISOVUE-370) INJECTION 76%
100.0000 mL | Freq: Once | INTRAVENOUS | Status: AC | PRN
  Administered 2018-10-21: 100 mL via INTRAVENOUS

## 2018-10-21 NOTE — Discharge Instructions (Addendum)
Take Meclizine for dizziness as prescribed and Zofran for nausea as needed. Push fluids. Follow up with your doctor for recheck if symptoms persist and return to the ED with any worsening symptoms or new urgent concerns.

## 2018-10-21 NOTE — ED Notes (Signed)
Patient transported to CT 

## 2018-10-30 DIAGNOSIS — M545 Low back pain: Secondary | ICD-10-CM | POA: Diagnosis not present

## 2018-12-16 ENCOUNTER — Telehealth: Payer: Self-pay | Admitting: Family Medicine

## 2018-12-16 NOTE — Telephone Encounter (Signed)
Called patient to inform her of the office restrictions due to the coronavirus. Patient informed not to come to visit if not feeling well and to call to get rescheduled. Patient verbalized understanding.  

## 2018-12-17 ENCOUNTER — Other Ambulatory Visit: Payer: Self-pay

## 2018-12-17 ENCOUNTER — Ambulatory Visit: Payer: BLUE CROSS/BLUE SHIELD | Admitting: Obstetrics & Gynecology

## 2018-12-17 VITALS — BP 114/79 | HR 77 | Wt 190.3 lb

## 2018-12-17 DIAGNOSIS — G8929 Other chronic pain: Secondary | ICD-10-CM

## 2018-12-17 DIAGNOSIS — Z0000 Encounter for general adult medical examination without abnormal findings: Secondary | ICD-10-CM

## 2018-12-17 DIAGNOSIS — Z23 Encounter for immunization: Secondary | ICD-10-CM | POA: Diagnosis not present

## 2018-12-17 DIAGNOSIS — R102 Pelvic and perineal pain: Secondary | ICD-10-CM | POA: Diagnosis not present

## 2018-12-17 MED ORDER — DROSPIRENONE-ETHINYL ESTRADIOL 3-0.02 MG PO TABS: 1.0000 | ORAL_TABLET | Freq: Every day | ORAL | 11 refills | Status: DC

## 2018-12-17 NOTE — Progress Notes (Signed)
   Subjective:    Patient ID: Jasmine Brewer, female    DOB: 03-05-1996, 23 y.o.   MRN: [id number]  HPI 23 yo single G0 here for follow up for management of CPP. She started OCPs about 3 months ago. The pain got better. However, she had irregular bleeding and stopped the pills about 2 weeks ago and the pain has returned. She had unprotected sex 10 days ago. She doesn't want a pregnancy right now. Her goal right now is to control the pain.    Review of Systems    GC and CT negative 11/19 She has been monogamous since 8/18 Works at [employer] FH- + breast cancer in paternal aunt, no gyn/colon cancer  Objective:   Physical Exam Breathing, conversing, and ambulating normally Breathing, conversing, and ambulating normally Abd- benign     Assessment & Plan:  BTB with OCPs - I will change to a yaz Preventative care- TDAP and flu vaccines today

## 2019-08-14 ENCOUNTER — Other Ambulatory Visit: Payer: Self-pay | Admitting: General Practice

## 2019-08-15 MED ORDER — DROSPIRENONE-ETHINYL ESTRADIOL 3-0.02 MG PO TABS: 1.0000 | ORAL_TABLET | Freq: Every day | ORAL | 1 refills | Status: DC

## 2019-08-19 ENCOUNTER — Other Ambulatory Visit: Payer: Self-pay

## 2019-08-19 NOTE — Telephone Encounter (Signed)
Received notification from [place] to refill pt BCP.  Routed to Dr. Hulan Fray for advisement.

## 2019-08-20 MED ORDER — DROSPIRENONE-ETHINYL ESTRADIOL 3-0.02 MG PO TABS: 1.0000 | ORAL_TABLET | Freq: Every day | ORAL | 1 refills | Status: DC

## 2020-04-01 DIAGNOSIS — Z03818 Encounter for observation for suspected exposure to other biological agents ruled out: Secondary | ICD-10-CM | POA: Diagnosis not present

## 2020-04-01 DIAGNOSIS — Z20822 Contact with and (suspected) exposure to covid-19: Secondary | ICD-10-CM | POA: Diagnosis not present

## 2020-05-14 ENCOUNTER — Encounter (HOSPITAL_COMMUNITY): Payer: Self-pay

## 2020-05-14 ENCOUNTER — Ambulatory Visit (HOSPITAL_COMMUNITY)
Admission: EM | Admit: 2020-05-14 | Discharge: 2020-05-14 | Disposition: A | Discharge status: Home / Self Care | Payer: BC Managed Care – PPO | Attending: Family Medicine | Admitting: Family Medicine

## 2020-05-14 ENCOUNTER — Other Ambulatory Visit: Payer: Self-pay

## 2020-05-14 DIAGNOSIS — Z87891 Personal history of nicotine dependence: Secondary | ICD-10-CM | POA: Insufficient documentation

## 2020-05-14 DIAGNOSIS — Z3202 Encounter for pregnancy test, result negative: Secondary | ICD-10-CM

## 2020-05-14 DIAGNOSIS — N912 Amenorrhea, unspecified: Secondary | ICD-10-CM | POA: Diagnosis not present

## 2020-05-14 LAB — POCT URINALYSIS DIPSTICK, ED / UC
Bilirubin Urine: NEGATIVE
Glucose, UA: NEGATIVE mg/dL
Hgb urine dipstick: NEGATIVE
Ketones, ur: NEGATIVE mg/dL
Leukocytes,Ua: NEGATIVE
Nitrite: NEGATIVE
Protein, ur: NEGATIVE mg/dL
Specific Gravity, Urine: 1.02 (ref 1.005–1.030)
Urobilinogen, UA: 0.2 mg/dL (ref 0.0–1.0)
pH: 7.5 (ref 5.0–8.0)

## 2020-05-14 LAB — POC URINE PREG, ED: Preg Test, Ur: NEGATIVE

## 2020-05-14 NOTE — ED Triage Notes (Signed)
Pt wants pregnancy test. LMP 03/31/2020 Pt c/o nausea, uncomfortable to sleep on stomach.

## 2020-05-14 NOTE — Discharge Instructions (Signed)
Your urine was negative for pregnancy and there is no urinary tract infection. I am sending swab for testing to check for STDs, bacterial vaginosis. I have put to contact on your discharge directions for follow-up with OB/GYN

## 2020-05-14 NOTE — ED Provider Notes (Signed)
[facility]    CSN: [id number] Arrival date & time: 05/14/20  1140      History   Chief Complaint Chief Complaint  Patient presents with   Possible Pregnancy    HPI Jasmine Brewer is a 24 y.o. female.   Patient is a 24 year old female presents today with possible pregnancy.  Reporting last menstrual period was 03/31/2020.  Took 3 pregnancy tests at home that were all negative.  She has had some mild nausea and uncomfortable sleeping on her stomach.  No vaginal discharge or bleeding.  No dysuria, hematuria or urinary frequency.  No fever, vomiting or diarrhea.  She is currently trying to conceive.  Last previously on birth control but a year ago.  Reporting her menstrual cycles are very heavy typically, regular.  ROS per HPI      Past Medical History:  Diagnosis Date   Anxiety    Medical history non-contributory     Patient Active Problem List   Diagnosis Date Noted   Chronic pelvic pain in female 12/17/2018   Obesity 09/20/2018    Past Surgical History:  Procedure Laterality Date   INDUCED ABORTION     WISDOM TOOTH EXTRACTION      OB History    Gravida  2   Para      Term      Preterm      AB  2   Living  0     SAB  1   TAB  1   Ectopic      Multiple      Live Births               Home Medications    Prior to Admission medications   Medication Sig Start Date End Date Taking? Authorizing Provider  drospirenone-ethinyl estradiol (YAZ) 3-0.02 MG tablet Take 1 tablet by mouth daily. 08/20/19 05/14/20  Allie Bossier, MD    Family History Family History  Problem Relation Age of Onset   Alcohol abuse Neg Hx    Arthritis Neg Hx    Asthma Neg Hx    Birth defects Neg Hx    Cancer Neg Hx    COPD Neg Hx    Depression Neg Hx    Diabetes Neg Hx    Drug abuse Neg Hx    Early death Neg Hx    Hearing loss Neg Hx    Heart disease Neg Hx    Hyperlipidemia Neg Hx    Hypertension Neg Hx    Kidney disease Neg  Hx    Learning disabilities Neg Hx    Mental illness Neg Hx    Mental retardation Neg Hx    Miscarriages / Stillbirths Neg Hx    Stroke Neg Hx    Vision loss Neg Hx    Varicose Veins Neg Hx     Social History Social History   Tobacco Use   Smoking status: Former Smoker    Types: Cigars   Smokeless tobacco: Never Used  Building services engineer Use: Never used  Substance Use Topics   Alcohol use: Not Currently    Comment: occasionally   Drug use: Yes    Types: Marijuana     Allergies   Shellfish allergy   Review of Systems Review of Systems   Physical Exam Triage Vital Signs ED Triage Vitals  Enc Vitals Group     BP 05/14/20 1205 125/75     Pulse Rate 05/14/20 1205 81  Resp 05/14/20 1205 16     Temp 05/14/20 1205 98.5 F (36.9 C)     Temp Source 05/14/20 1205 Oral     SpO2 05/14/20 1205 100 %     Weight 05/14/20 1207 190 lb (86.2 kg)     Height 05/14/20 1207 5\' 5"  (1.651 m)     Head Circumference --      Peak Flow --      Pain Score 05/14/20 1207 0     Pain Loc --      Pain Edu? --      Excl. in GC? --    No data found.  Updated Vital Signs BP 125/75    Pulse 81    Temp 98.5 F (36.9 C) (Oral)    Resp 16    Ht 5\' 5"  (1.651 m)    Wt 190 lb (86.2 kg)    SpO2 100%    BMI 31.62 kg/m   Visual Acuity Right Eye Distance:   Left Eye Distance:   Bilateral Distance:    Right Eye Near:   Left Eye Near:    Bilateral Near:     Physical Exam Vitals and nursing note reviewed.  Constitutional:      General: She is not in acute distress.    Appearance: Normal appearance. She is not ill-appearing, toxic-appearing or diaphoretic.  HENT:     Head: Normocephalic.     Nose: Nose normal.  Eyes:     Conjunctiva/sclera: Conjunctivae normal.  Pulmonary:     Effort: Pulmonary effort is normal.  Abdominal:     General: Bowel sounds are normal.     Palpations: Abdomen is soft.     Tenderness: There is no abdominal tenderness.     Comments: No masses  felt  Musculoskeletal:        General: Normal range of motion.     Cervical back: Normal range of motion.  Skin:    General: Skin is warm and dry.     Findings: No rash.  Neurological:     Mental Status: She is alert.  Psychiatric:        Mood and Affect: Mood normal.      UC Treatments / Results  Labs (all labs ordered are listed, but only abnormal results are displayed) Labs Reviewed  POC URINE PREG, ED  POCT URINALYSIS DIPSTICK, ED / UC  CERVICOVAGINAL ANCILLARY ONLY    EKG   Radiology No results found.  Procedures Procedures (including critical care time)  Medications Ordered in UC Medications - No data to display  Initial Impression / Assessment and Plan / UC Course  I have reviewed the triage vital signs and the nursing notes.  Pertinent labs & imaging results that were available during my care of the patient were reviewed by me and considered in my medical decision making (see chart for details).     Amenorrhea Pregnancy test negative here today. Exam benign Urine negative for infection.  We will send the swab for STD screening Recommended follow-up with OB/GYN for amenorrhea Final Clinical Impressions(s) / UC Diagnoses   Final diagnoses:  Amenorrhea     Discharge Instructions     Your urine was negative for pregnancy and there is no urinary tract infection. I am sending swab for testing to check for STDs, bacterial vaginosis. I have put to contact on your discharge directions for follow-up with OB/GYN    ED Prescriptions    None     PDMP not reviewed this  encounter.   Janace Aris, NP 05/14/20 1324

## 2020-05-16 ENCOUNTER — Other Ambulatory Visit: Payer: Self-pay

## 2020-05-16 ENCOUNTER — Emergency Department (HOSPITAL_COMMUNITY)
Admission: EM | Admit: 2020-05-16 | Discharge: 2020-05-17 | Disposition: A | Discharge status: Home / Self Care | Payer: BC Managed Care – PPO | Attending: Emergency Medicine | Admitting: Emergency Medicine

## 2020-05-16 ENCOUNTER — Encounter (HOSPITAL_COMMUNITY): Payer: Self-pay | Admitting: Emergency Medicine

## 2020-05-16 DIAGNOSIS — F1590 Other stimulant use, unspecified, uncomplicated: Secondary | ICD-10-CM | POA: Diagnosis not present

## 2020-05-16 DIAGNOSIS — M545 Low back pain, unspecified: Secondary | ICD-10-CM

## 2020-05-16 DIAGNOSIS — U071 COVID-19: Secondary | ICD-10-CM | POA: Insufficient documentation

## 2020-05-16 DIAGNOSIS — Z87891 Personal history of nicotine dependence: Secondary | ICD-10-CM | POA: Insufficient documentation

## 2020-05-16 DIAGNOSIS — Z79899 Other long term (current) drug therapy: Secondary | ICD-10-CM | POA: Diagnosis not present

## 2020-05-16 DIAGNOSIS — R519 Headache, unspecified: Secondary | ICD-10-CM | POA: Insufficient documentation

## 2020-05-16 LAB — URINALYSIS, ROUTINE W REFLEX MICROSCOPIC
Bilirubin Urine: NEGATIVE
Glucose, UA: NEGATIVE mg/dL
Hgb urine dipstick: NEGATIVE
Ketones, ur: NEGATIVE mg/dL
Leukocytes,Ua: NEGATIVE
Nitrite: NEGATIVE
Protein, ur: NEGATIVE mg/dL
Specific Gravity, Urine: 1.02 (ref 1.005–1.030)
pH: 5 (ref 5.0–8.0)

## 2020-05-16 LAB — I-STAT BETA HCG BLOOD, ED (MC, WL, AP ONLY): I-stat hCG, quantitative: <5 m[IU]/mL (ref <5)

## 2020-05-16 MED ORDER — ACETAMINOPHEN 325 MG PO TABS
650.0000 mg | ORAL_TABLET | Freq: Once | ORAL | Status: AC | PRN
  Administered 2020-05-16: 650 mg via ORAL
  Filled 2020-05-16: qty 2

## 2020-05-16 NOTE — ED Triage Notes (Signed)
Pt c/o lower back pain that started today, denies urinary symptoms. Also reports she has missed a period.

## 2020-05-17 ENCOUNTER — Telehealth (HOSPITAL_COMMUNITY): Payer: Self-pay

## 2020-05-17 LAB — CERVICOVAGINAL ANCILLARY ONLY
Bacterial Vaginitis (gardnerella): POSITIVE — AB
Candida Glabrata: NEGATIVE
Candida Vaginitis: NEGATIVE
Chlamydia: NEGATIVE
Comment: NEGATIVE
Comment: NEGATIVE
Comment: NEGATIVE
Comment: NEGATIVE
Comment: NEGATIVE
Comment: NORMAL
Neisseria Gonorrhea: NEGATIVE
Trichomonas: NEGATIVE

## 2020-05-17 LAB — SARS CORONAVIRUS 2 BY RT PCR (HOSPITAL ORDER, PERFORMED IN ~~LOC~~ HOSPITAL LAB): SARS Coronavirus 2: POSITIVE — AB

## 2020-05-17 MED ORDER — ACETAMINOPHEN 500 MG PO TABS
1000.0000 mg | ORAL_TABLET | Freq: Once | ORAL | Status: AC
  Administered 2020-05-17: 1000 mg via ORAL
  Filled 2020-05-17: qty 2

## 2020-05-17 NOTE — Discharge Instructions (Signed)
Please take tylenol and/or ibuprofen as needed for pain/symptoms.  Follow-up on your COVID-19 test here today.  Return to ED for any worsening or concerning symptoms.  Please follow-up with OB/GYN.

## 2020-05-17 NOTE — ED Provider Notes (Signed)
[facility] EMERGENCY DEPARTMENT Provider Note   CSN: [id number] Arrival date & time: 05/16/20  1943     History Chief Complaint  Patient presents with  . Back Pain    Jasmine Brewer is a 24 y.o. female.  HPI   Patient presents to the ED from home for multiple complaints.  Patient states she has had lower back pain over the last several days.  It is been intermittent in nature.  Throbbing.  Patient denies any urinary symptoms such as dysuria, foul-smelling urine or any other similar symptoms.  She denies any fevers or chills.  Patient has had recent COVID-19 exposure but states she has taken multiple home COVID-19 test that were negative.  She endorses mild headache.  History of headaches.  Denies any chest pain or shortness of breath.  No diarrhea.  No nausea or vomiting.  Has not received COVID-19 vaccination.  Patient is also concerned because her last menstrual period was on 6/30 and she is concerned about pregnancy.  Patient reports she has taken multiple home pregnancy tests that were also negative.      Past Medical History:  Diagnosis Date  . Anxiety   . Medical history non-contributory     Patient Active Problem List   Diagnosis Date Noted  . Chronic pelvic pain in female 12/17/2018  . Obesity 09/20/2018    Past Surgical History:  Procedure Laterality Date  . INDUCED ABORTION    . WISDOM TOOTH EXTRACTION       OB History    Gravida  2   Para      Term      Preterm      AB  2   Living  0     SAB  1   TAB  1   Ectopic      Multiple      Live Births              Family History  Problem Relation Age of Onset  . Alcohol abuse Neg Hx   . Arthritis Neg Hx   . Asthma Neg Hx   . Birth defects Neg Hx   . Cancer Neg Hx   . COPD Neg Hx   . Depression Neg Hx   . Diabetes Neg Hx   . Drug abuse Neg Hx   . Early death Neg Hx   . Hearing loss Neg Hx   . Heart disease Neg Hx   . Hyperlipidemia Neg Hx   . Hypertension Neg Hx    . Kidney disease Neg Hx   . Learning disabilities Neg Hx   . Mental illness Neg Hx   . Mental retardation Neg Hx   . Miscarriages / Stillbirths Neg Hx   . Stroke Neg Hx   . Vision loss Neg Hx   . Varicose Veins Neg Hx     Social History   Tobacco Use  . Smoking status: Former Smoker    Types: Cigars  . Smokeless tobacco: Never Used  Vaping Use  . Vaping Use: Never used  Substance Use Topics  . Alcohol use: Not Currently    Comment: occasionally  . Drug use: Yes    Types: Marijuana    Home Medications Prior to Admission medications   Medication Sig Start Date End Date Taking? Authorizing Provider  drospirenone-ethinyl estradiol (YAZ) 3-0.02 MG tablet Take 1 tablet by mouth daily. 08/20/19 05/14/20  Allie Bossier, MD    Allergies    Shellfish  allergy  Review of Systems   Review of Systems  Constitutional: Negative for chills and fever.  HENT: Negative for ear pain and sore throat.   Eyes: Negative for pain and visual disturbance.  Respiratory: Negative for cough and shortness of breath.   Cardiovascular: Negative for chest pain and palpitations.  Gastrointestinal: Negative for abdominal pain, diarrhea, nausea and vomiting.  Genitourinary: Negative for dysuria and hematuria.  Musculoskeletal: Positive for back pain. Negative for arthralgias.  Skin: Negative for color change and rash.  Neurological: Negative for seizures and syncope.  All other systems reviewed and are negative.   Physical Exam Updated Vital Signs BP (!) 134/98   Pulse 94   Temp 98.6 F (37 C) (Oral)   Resp 16   Ht 5\' 5"  (1.651 m)   Wt 86 kg   LMP 03/31/2020   SpO2 100%   BMI 31.55 kg/m   Physical Exam Vitals and nursing note reviewed.  Constitutional:      General: She is not in acute distress.    Appearance: Normal appearance. She is well-developed and normal weight. She is not ill-appearing or toxic-appearing.  HENT:     Head: Normocephalic and atraumatic.     Mouth/Throat:      Mouth: Mucous membranes are moist.     Pharynx: Oropharynx is clear.  Eyes:     Extraocular Movements: Extraocular movements intact.     Conjunctiva/sclera: Conjunctivae normal.     Pupils: Pupils are equal, round, and reactive to light.  Cardiovascular:     Rate and Rhythm: Normal rate and regular rhythm.     Heart sounds: No murmur heard.   Pulmonary:     Effort: Pulmonary effort is normal. No respiratory distress.     Breath sounds: Normal breath sounds.  Abdominal:     General: There is no distension.     Palpations: Abdomen is soft.     Tenderness: There is no abdominal tenderness. There is no right CVA tenderness, left CVA tenderness or guarding.  Musculoskeletal:     Cervical back: Normal range of motion and neck supple. No rigidity or bony tenderness.     Thoracic back: No bony tenderness.     Lumbar back: No bony tenderness.  Skin:    General: Skin is warm and dry.     Capillary Refill: Capillary refill takes less than 2 seconds.  Neurological:     General: No focal deficit present.     Mental Status: She is alert and oriented to person, place, and time.  Psychiatric:        Mood and Affect: Mood normal.        Behavior: Behavior normal.     ED Results / Procedures / Treatments   Labs (all labs ordered are listed, but only abnormal results are displayed) Labs Reviewed  SARS CORONAVIRUS 2 BY RT PCR (HOSPITAL ORDER, PERFORMED IN [facility])  URINALYSIS, ROUTINE W REFLEX MICROSCOPIC  I-STAT BETA HCG BLOOD, ED ([facility], [facility], [facility] ONLY)    EKG None  Radiology No results found.  Procedures Procedures (including critical care time)  Medications Ordered in ED Medications  acetaminophen (TYLENOL) tablet 650 mg (650 mg Oral Given 05/16/20 2221)    ED Course   Jasmine Brewer is a 24 y.o. female with PMHx listed that presents to the Emergency Department complaint of Back Pain  ED Course: Initial exam completed.   Well-appearing and hemodynamically stable.   Nontoxic and afebrile.  Physical exam significant for age-appropriate  24 year old female with no CVA tenderness, abdomen soft, nondistended, nonfocally tender, no midline back tenderness, and normal neurovascular examination without focal deficits.  Initial differential includes cystitis, pyelonephritis, STD/PID, ovarian cyst/torsion, viral illness, appendicitis, and urolithiasis.     Triage work-up reviewed.  Pregnancy negative.  UA without signs of infection, dehydration, or hematuria.  Without evidence of hematuria, doubt significant urolithiasis.  EMR review shows patient seen on 8/13 at urgent care; urine at that time was negative for infection/pregnancy at that time as well.  Cervical swab was completed and negative per the patient.  Repeat pelvic exam at this time.  Given the bilateral nature of the discomfort, doubt ovarian cyst.  No right lower quadrant tenderness; doubt appendicitis.  Overall low concern for acute pathology at this time; likely musculoskeletal discomfort..  Reassured patient regarding her pregnancy status.  COVID-19 pending at discharge.  Encouraged follow-up with obstetrics/gynecology.  Vital Signs: reviewed Labs: reviewed and significant findings discussed above Records: nursing notes along with previous records reviewed and pertinent data discussed   Consults:  none   Reevaluation/Disposition:  Upon reevaluation, patients symptoms stable/improved. No active nausea/vomiting and ambulatory without assistance prior to discharge from the emergency department.    All questions answered.  Strict return precautions were discussed. Additionally we discussed establishing and/or following-up with primary care physician.  Patient and/or family was understanding and in agreement with today's assessment and plan.   Campbell Riches, MD Emergency Medicine, PGY-3   Note: Dragon medical dictation software was used in the creation of this note.   Final Clinical Impression(s) / ED  Diagnoses Final diagnoses:  None    Rx / DC Orders ED Discharge Orders    None       Nino Parsley, MD 05/17/20 1405    Gerhard Munch, MD 05/17/20 1549

## 2020-05-18 ENCOUNTER — Telehealth (HOSPITAL_COMMUNITY): Payer: Self-pay | Admitting: Emergency Medicine

## 2020-05-18 MED ORDER — METRONIDAZOLE 500 MG PO TABS
500.0000 mg | ORAL_TABLET | Freq: Two times a day (BID) | ORAL | 0 refills | Status: DC
Start: 2020-05-18 — End: 2020-11-17

## 2020-05-19 DIAGNOSIS — R0602 Shortness of breath: Secondary | ICD-10-CM | POA: Diagnosis not present

## 2020-05-19 DIAGNOSIS — R05 Cough: Secondary | ICD-10-CM | POA: Diagnosis not present

## 2020-05-19 DIAGNOSIS — U071 COVID-19: Secondary | ICD-10-CM | POA: Diagnosis not present

## 2020-05-19 DIAGNOSIS — R110 Nausea: Secondary | ICD-10-CM | POA: Diagnosis not present

## 2020-06-15 DIAGNOSIS — Z03818 Encounter for observation for suspected exposure to other biological agents ruled out: Secondary | ICD-10-CM | POA: Diagnosis not present

## 2020-06-18 DIAGNOSIS — Z20822 Contact with and (suspected) exposure to covid-19: Secondary | ICD-10-CM | POA: Diagnosis not present

## 2020-08-22 IMAGING — US US PELVIS COMPLETE
1 series · 13 of 25 positions shown · non-contrast
Comparison: June 12, 2017

CLINICAL DATA: Pelvic pain.

EXAM:
TRANSABDOMINAL AND TRANSVAGINAL ULTRASOUND OF PELVIS
DOPPLER ULTRASOUND OF OVARIES
TECHNIQUE: Both transabdominal and transvaginal ultrasound examinations of the
pelvis were performed. Transabdominal technique was performed for
global imaging of the pelvis including uterus, ovaries, adnexal
regions, and pelvic cul-de-sac.
It was necessary to proceed with endovaginal exam following the
transabdominal exam to visualize the ovaries. Color and duplex
Doppler ultrasound was utilized to evaluate blood flow to the
ovaries.

[Series 1: us pelvis complete · 0.18mm/px · 13 of 86 slices shown]
[im 1/86]
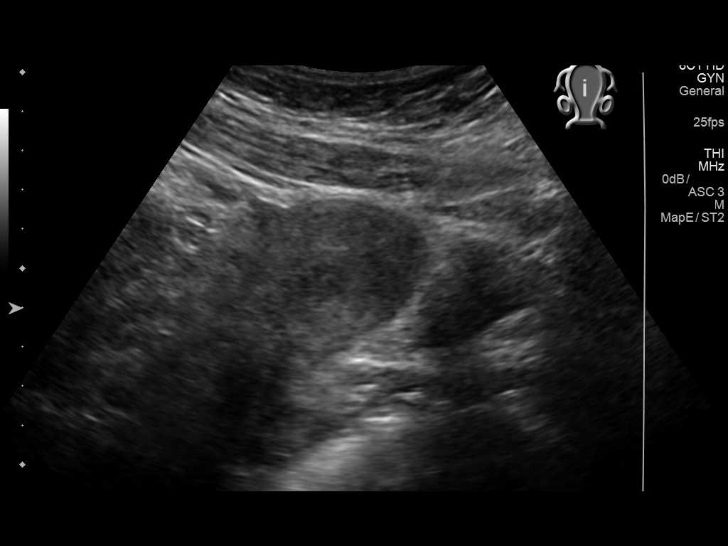
[im 8/86]
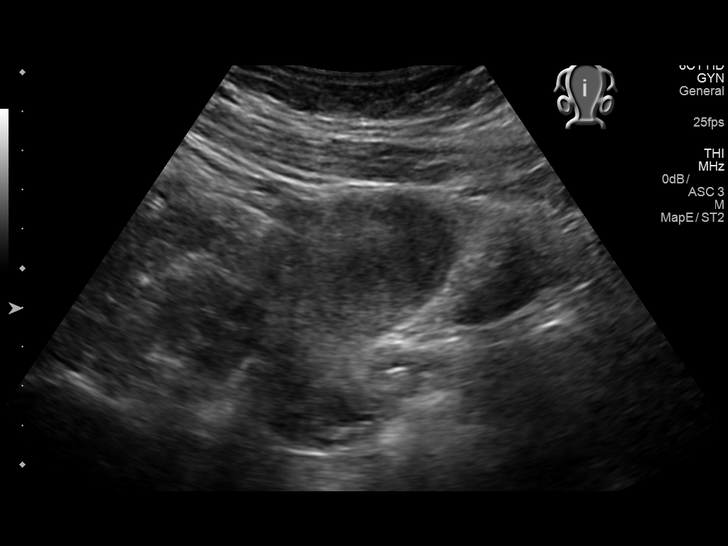
[im 15/86]
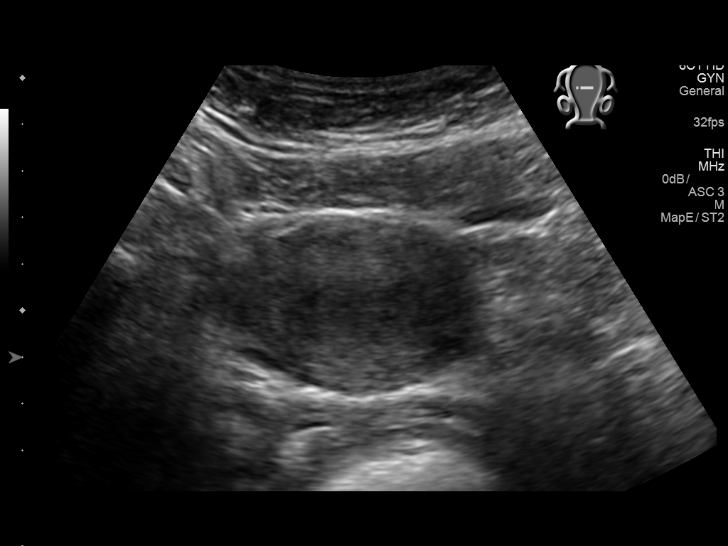
[im 22/86]
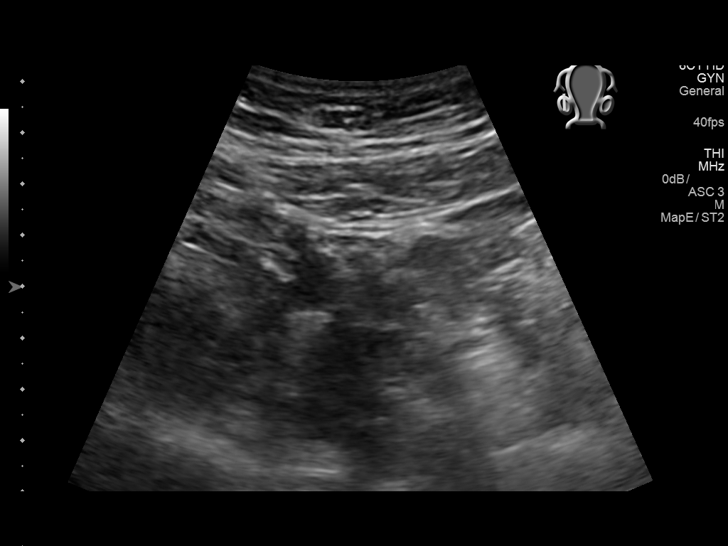
[im 29/86]
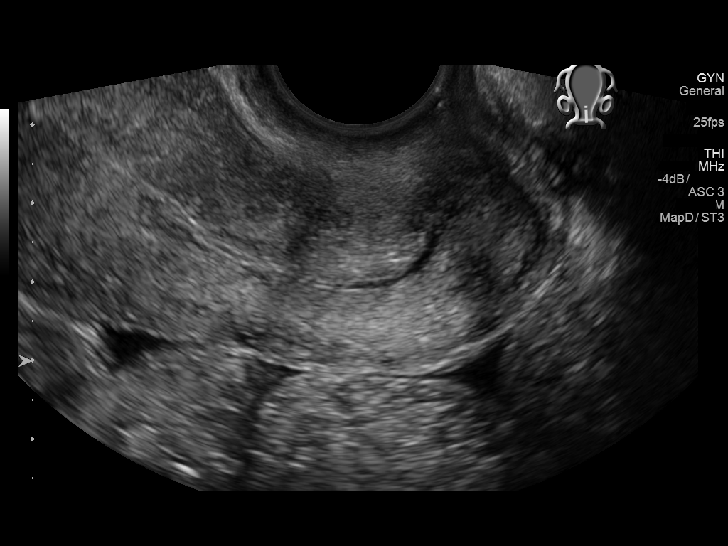
[im 36/86]
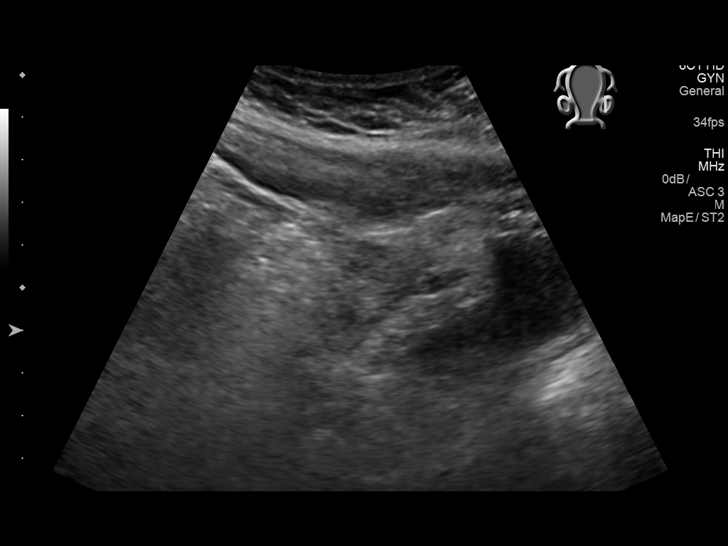
[im 43/86]
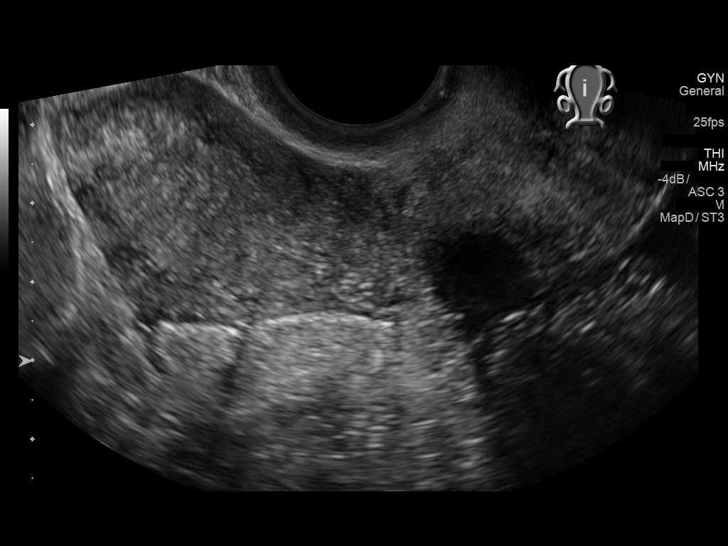
[im 50/86]
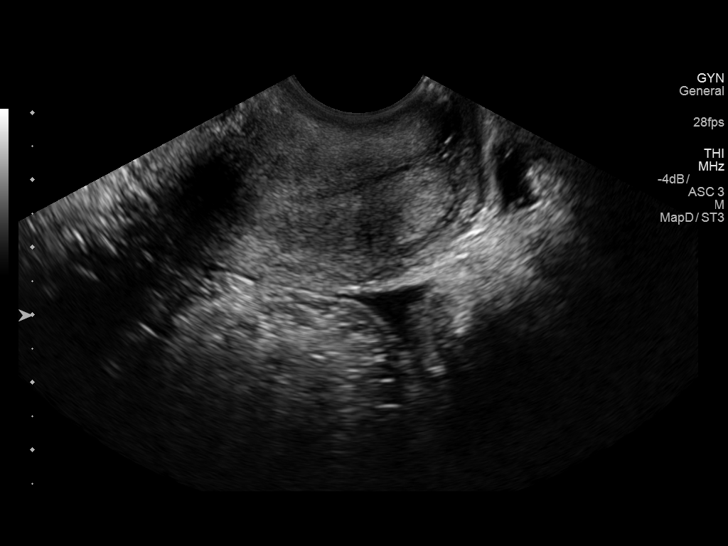
[im 57/86]
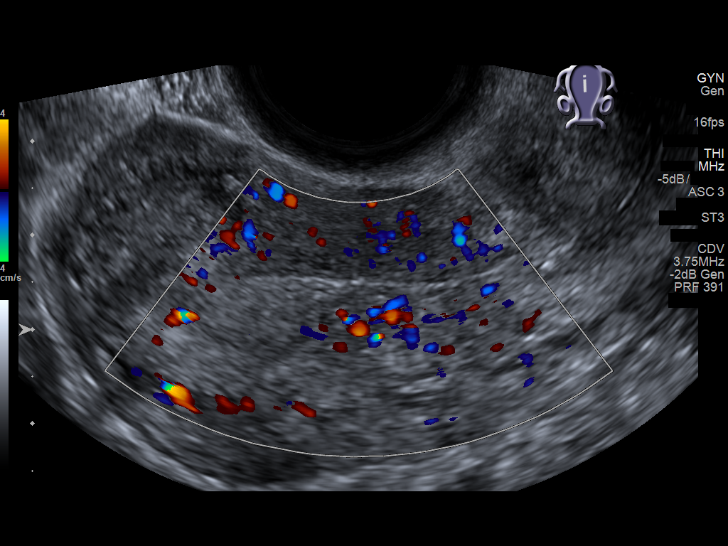
[im 64/86]
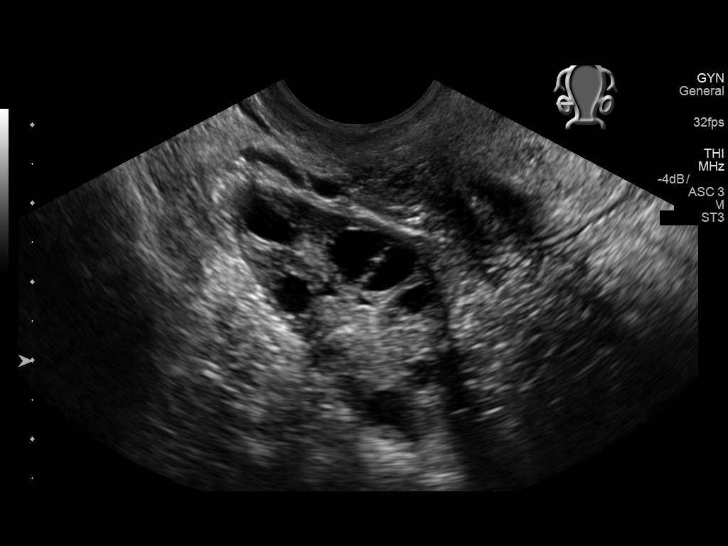
[im 71/86]
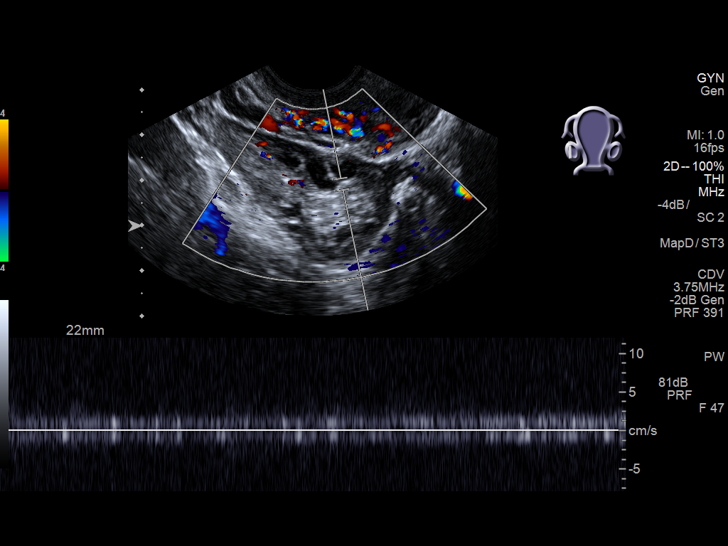
[im 78/86]
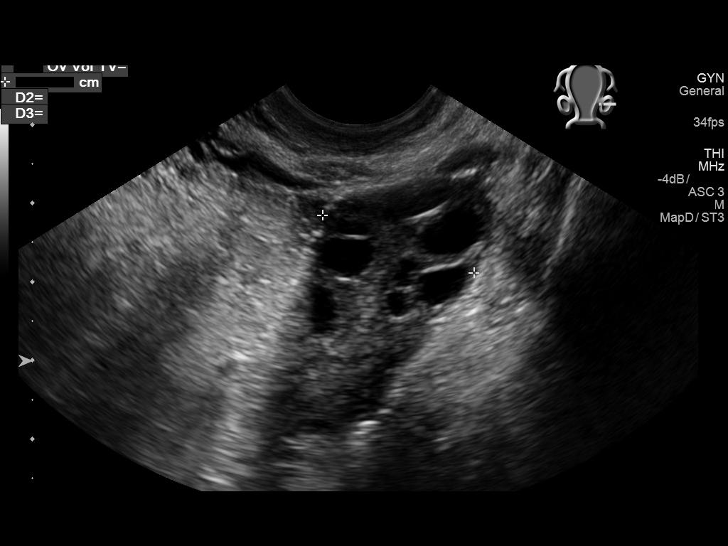
[im 86/86]
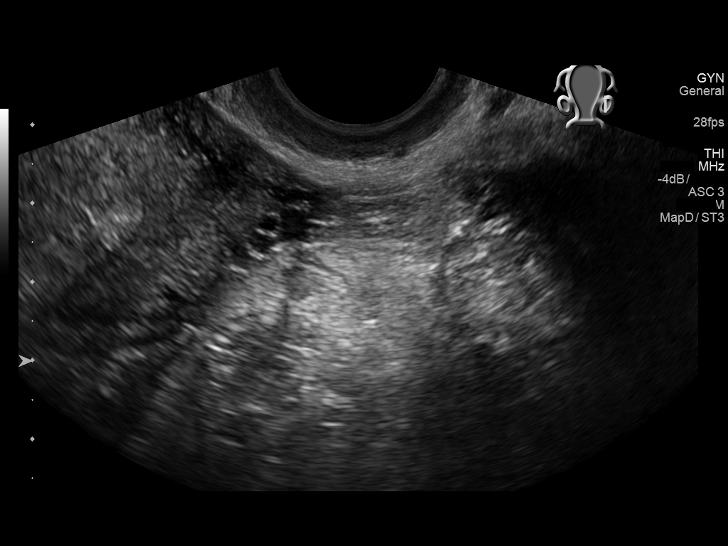

[13 of 25 positions shown; findings below may reference images not displayed]

FINDINGS: Uterus

Measurements: 8.1 x 3.9 x 4.8 cm = volume: 79.5 mL. No fibroids or
other mass visualized.

Endometrium

Thickness: 6.7 mm. There is focal hyperechoic thickening of the left
endometrial wall measuring 2 x 0.5 x 0.7 cm.

Right ovary

Measurements: 4.4 x 2.8 x 2.1 cm = volume: 13.6 mL. Normal
appearance/no adnexal mass.m

Left ovary

Measurements: 3.7 x 2.5 x 2.1 cm = volume: 10 mL. Normal
appearance/no adnexal mass.

Pulsed Doppler evaluation of both ovaries demonstrates normal
low-resistance arterial and venous waveforms.

Other findings

No abnormal free fluid.
IMPRESSION: Focal hyperechoic thickening of the left endometrial wall measuring
2 x 20.5 x 0.7 cm. This is nonspecific.

The uterus and ovaries are otherwise normal.

## 2020-11-05 ENCOUNTER — Ambulatory Visit (HOSPITAL_COMMUNITY)
Admission: EM | Admit: 2020-11-05 | Discharge: 2020-11-05 | Disposition: A | Discharge status: Home / Self Care | Payer: 59

## 2020-11-05 ENCOUNTER — Other Ambulatory Visit: Payer: Self-pay

## 2020-11-05 ENCOUNTER — Encounter (HOSPITAL_COMMUNITY): Payer: Self-pay | Admitting: Emergency Medicine

## 2020-11-05 DIAGNOSIS — S39012A Strain of muscle, fascia and tendon of lower back, initial encounter: Secondary | ICD-10-CM | POA: Diagnosis not present
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
## 2020-11-17 ENCOUNTER — Encounter (HOSPITAL_BASED_OUTPATIENT_CLINIC_OR_DEPARTMENT_OTHER): Payer: Self-pay | Admitting: *Deleted

## 2020-11-17 ENCOUNTER — Emergency Department (HOSPITAL_BASED_OUTPATIENT_CLINIC_OR_DEPARTMENT_OTHER)
Admission: EM | Admit: 2020-11-17 | Discharge: 2020-11-17 | Disposition: A | Discharge status: Home / Self Care | Payer: 59 | Attending: Emergency Medicine | Admitting: Emergency Medicine

## 2020-11-17 ENCOUNTER — Emergency Department (HOSPITAL_BASED_OUTPATIENT_CLINIC_OR_DEPARTMENT_OTHER): Payer: 59

## 2020-11-17 ENCOUNTER — Other Ambulatory Visit: Payer: Self-pay

## 2020-11-17 DIAGNOSIS — S39012A Strain of muscle, fascia and tendon of lower back, initial encounter: Secondary | ICD-10-CM | POA: Diagnosis not present

## 2020-11-17 DIAGNOSIS — Y92410 Unspecified street and highway as the place of occurrence of the external cause: Secondary | ICD-10-CM | POA: Diagnosis not present

## 2020-11-17 DIAGNOSIS — S3992XA Unspecified injury of lower back, initial encounter: Secondary | ICD-10-CM | POA: Diagnosis present

## 2020-11-17 LAB — URINALYSIS, ROUTINE W REFLEX MICROSCOPIC
Bilirubin Urine: NEGATIVE
Glucose, UA: NEGATIVE mg/dL
Hgb urine dipstick: NEGATIVE
Ketones, ur: NEGATIVE mg/dL
Leukocytes,Ua: NEGATIVE
Nitrite: NEGATIVE
Protein, ur: NEGATIVE mg/dL
Specific Gravity, Urine: 1.01 (ref 1.005–1.030)
pH: 7 (ref 5.0–8.0)

## 2020-11-17 LAB — PREGNANCY, URINE: Preg Test, Ur: NEGATIVE

## 2020-11-17 MED ORDER — IBUPROFEN 400 MG PO TABS
400.0000 mg | ORAL_TABLET | Freq: Four times a day (QID) | ORAL | 0 refills | Status: AC | PRN
Start: 2020-11-17 — End: ?

## 2020-11-17 MED ORDER — HYDROCODONE-ACETAMINOPHEN 5-325 MG PO TABS: 1.0000 | ORAL_TABLET | Freq: Four times a day (QID) | ORAL | 0 refills | Status: DC | PRN

## 2020-11-17 MED ORDER — METHOCARBAMOL 500 MG PO TABS
500.0000 mg | ORAL_TABLET | Freq: Once | ORAL | Status: AC
  Administered 2020-11-17: 500 mg via ORAL
  Filled 2020-11-17: qty 1
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
## 2020-11-17 NOTE — Discharge Instructions (Addendum)
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
## 2020-11-17 NOTE — ED Notes (Signed)
Heat pack and extra pillow given

## 2023-04-25 ENCOUNTER — Other Ambulatory Visit: Payer: Self-pay

## 2023-04-25 ENCOUNTER — Emergency Department (HOSPITAL_BASED_OUTPATIENT_CLINIC_OR_DEPARTMENT_OTHER): Payer: 59 | Admitting: Radiology

## 2023-04-25 ENCOUNTER — Emergency Department (HOSPITAL_BASED_OUTPATIENT_CLINIC_OR_DEPARTMENT_OTHER)
Admission: EM | Admit: 2023-04-25 | Discharge: 2023-04-26 | Disposition: A | Discharge status: Home / Self Care | Payer: 59 | Attending: Emergency Medicine | Admitting: Emergency Medicine

## 2023-04-25 DIAGNOSIS — R0789 Other chest pain: Secondary | ICD-10-CM | POA: Insufficient documentation

## 2023-04-25 LAB — BASIC METABOLIC PANEL
Anion gap: 11 (ref 5–15)
BUN: 15 mg/dL (ref 6–20)
CO2: 22 mmol/L (ref 22–32)
Calcium: 9.6 mg/dL (ref 8.9–10.3)
Chloride: 104 mmol/L (ref 98–111)
Creatinine, Ser: 0.82 mg/dL (ref 0.44–1.00)
GFR, Estimated: >60 mL/min (ref >60)
Glucose, Bld: 110 mg/dL — ABNORMAL HIGH (ref 70–99)
Potassium: 3.5 mmol/L (ref 3.5–5.1)
Sodium: 137 mmol/L (ref 135–145)

## 2023-04-25 LAB — CBC
HCT: 35.1 % — ABNORMAL LOW (ref 36.0–46.0)
Hemoglobin: 11.3 g/dL — ABNORMAL LOW (ref 12.0–15.0)
MCH: 27 pg (ref 26.0–34.0)
MCHC: 32.2 g/dL (ref 30.0–36.0)
MCV: 84 fL (ref 80.0–100.0)
Platelets: 371 10*3/uL (ref 150–400)
RBC: 4.18 MIL/uL (ref 3.87–5.11)
RDW: 15.7 % — ABNORMAL HIGH (ref 11.5–15.5)
WBC: 8.5 10*3/uL (ref 4.0–10.5)
nRBC: 0 % (ref 0.0–0.2)

## 2023-04-25 LAB — TROPONIN I (HIGH SENSITIVITY): Troponin I (High Sensitivity): <2 ng/L (ref <18)

## 2023-04-25 NOTE — ED Triage Notes (Signed)
CPx3 days. Left sided and central. +N/ +HA.  Had recent workup for syncope episodes. No dizziness at this time.

## 2023-04-26 LAB — TROPONIN I (HIGH SENSITIVITY): Troponin I (High Sensitivity): <2 ng/L (ref <18)

## 2023-04-26 NOTE — Discharge Instructions (Addendum)
Follow-up with cardiology.  The contact information for the cardiology clinic on [facility] has been provided in this discharge summary for you to call and make these arrangements.  Return to the emergency department if symptoms significantly worsen or change.

## 2023-04-26 NOTE — ED Provider Notes (Signed)
[facility] Provider Note   CSN: [id number] Arrival date & time: 04/25/23  2125     History  Chief Complaint  Patient presents with   Chest Pain    Jasmine Brewer is a 27 y.o. female.  Patient is a 27 year old female with no significant past medical history.  Patient presenting for evaluation of chest discomfort.  She has been experiencing near syncopal episodes recently for which she has seen her primary doctor.  Primary doctor is arranging what sounds like a Holter monitor and other test to help explain this.  For the past several days, she has been experiencing a sharp pain in the left upper chest that occurs when she breathes deep or laughs.  She denies shortness of breath or leg swelling.  She denies any fevers or chills.  No cough.  She was advised by her doctor's office to come here to be evaluated.  The history is provided by the patient.       Home Medications Prior to Admission medications   Medication Sig Start Date End Date Taking? Authorizing Provider  HYDROcodone-acetaminophen (NORCO/VICODIN) 5-325 MG tablet Take 1 tablet by mouth every 6 (six) hours as needed for severe pain. 11/17/20   Zadie Rhine, MD  ibuprofen (ADVIL) 400 MG tablet Take 1 tablet (400 mg total) by mouth every 6 (six) hours as needed. 11/17/20   Zadie Rhine, MD  drospirenone-ethinyl estradiol (YAZ) 3-0.02 MG tablet Take 1 tablet by mouth daily. 08/20/19 05/14/20  Allie Bossier, MD      Allergies    Shellfish allergy    Review of Systems   Review of Systems  All other systems reviewed and are negative.   Physical Exam Updated Vital Signs There were no vitals taken for this visit. Physical Exam Vitals and nursing note reviewed.  Constitutional:      General: She is not in acute distress.    Appearance: She is well-developed. She is not diaphoretic.  HENT:     Head: Normocephalic and atraumatic.  Cardiovascular:     Rate and Rhythm:  Normal rate and regular rhythm.     Heart sounds: No murmur heard.    No friction rub. No gallop.  Pulmonary:     Effort: Pulmonary effort is normal. No respiratory distress.     Breath sounds: Normal breath sounds. No wheezing.  Abdominal:     General: Bowel sounds are normal. There is no distension.     Palpations: Abdomen is soft.     Tenderness: There is no abdominal tenderness.  Musculoskeletal:        General: Normal range of motion.     Cervical back: Normal range of motion and neck supple.     Right lower leg: No tenderness. No edema.     Left lower leg: No tenderness. No edema.  Skin:    General: Skin is warm and dry.  Neurological:     General: No focal deficit present.     Mental Status: She is alert and oriented to person, place, and time.     ED Results / Procedures / Treatments   Labs (all labs ordered are listed, but only abnormal results are displayed) Labs Reviewed  BASIC METABOLIC PANEL - Abnormal; Notable for the following components:      Result Value   Glucose, Bld 110 (*)    All other components within normal limits  CBC - Abnormal; Notable for the following components:   Hemoglobin 11.3 (*)  HCT 35.1 (*)    RDW 15.7 (*)    All other components within normal limits  PREGNANCY, URINE  TROPONIN I (HIGH SENSITIVITY)  TROPONIN I (HIGH SENSITIVITY)    EKG EKG Interpretation Date/Time:  Wednesday April 25 2023 21:45:15 EDT Ventricular Rate:  88 PR Interval:  124 QRS Duration:  74 QT Interval:  332 QTC Calculation: 401 R Axis:   52  Text Interpretation: Normal sinus rhythm with sinus arrhythmia Nonspecific T wave abnormality Abnormal ECG No previous ECGs available Confirmed by Geoffery Lyons ([id number]) on 04/26/2023 12:44:39 AM  Radiology DG Chest 2 View  Result Date: 04/25/2023 CLINICAL DATA:  Chest pain for several days, initial encounter EXAM: CHEST - 2 VIEW COMPARISON:  09/17/14 FINDINGS: The heart size and mediastinal contours are within normal  limits. Both lungs are clear. The visualized skeletal structures are unremarkable. IMPRESSION: No active cardiopulmonary disease. Electronically Signed   By: Alcide Clever M.D.   On: 04/25/2023 21:43    Procedures Procedures    Medications Ordered in ED Medications - No data to display  ED Course/ Medical Decision Making/ A&P  Patient presenting with chest discomfort as described in the HPI.  She arrives here with stable vital signs and is clinically well-appearing.  Physical examination is normal.  Workup initiated including CBC, metabolic panel, and troponin x 2.  All studies unremarkable.  Chest x-ray is clear.  Patient has been observed here for several hours and vitals have remained stable.  Her oxygen saturations are 100% on room air with no tachycardia.  I highly doubt pulmonary embolism.  I also highly doubt a cardiac etiology or other emergent pathologies such as dissection.  X-rays showed no evidence for pneumothorax.  At this point, I feel as though I have ruled out emergent pathology and the patient can be discharged to home.  I will refer her to cardiology to discuss performing further studies that might explain her near syncopal episodes.  To return as needed if symptoms worsen or change.  Final Clinical Impression(s) / ED Diagnoses Final diagnoses:  None    Rx / DC Orders ED Discharge Orders     None         Geoffery Lyons, MD 04/26/23 [phone]

## 2023-07-05 ENCOUNTER — Telehealth: Payer: Self-pay | Admitting: Cardiology

## 2023-07-05 NOTE — Telephone Encounter (Signed)
Patient wanted earlier NP appt. Switched her to see Dr. Jens Som on tomorrow, 10/4 at 1:00 pm

## 2023-07-06 ENCOUNTER — Ambulatory Visit: Payer: 59 | Attending: Internal Medicine | Admitting: Cardiology

## 2023-07-06 ENCOUNTER — Encounter: Payer: Self-pay | Admitting: Cardiology

## 2023-07-06 VITALS — BP 130/80 | HR 62 | Ht 65.0 in | Wt 191.0 lb

## 2023-07-06 DIAGNOSIS — R55 Syncope and collapse: Secondary | ICD-10-CM | POA: Diagnosis not present

## 2023-07-06 DIAGNOSIS — Z136 Encounter for screening for cardiovascular disorders: Secondary | ICD-10-CM | POA: Diagnosis not present

## 2023-07-06 DIAGNOSIS — R072 Precordial pain: Secondary | ICD-10-CM | POA: Diagnosis not present

## 2023-07-06 NOTE — Patient Instructions (Signed)
   Testing/Procedures:  Your physician has requested that you have an exercise tolerance test. For further information please visit [email or web address] Please also follow instruction sheet, as given. [address]  Your physician has requested that you have an echocardiogram. Echocardiography is a painless test that uses sound waves to create images of your heart. It provides your doctor with information about the size and shape of your heart and how well your heart's chambers and valves are working. This procedure takes approximately one hour. There are no restrictions for this procedure. Please do NOT wear cologne, perfume, aftershave, or lotions (deodorant is allowed). Please arrive 15 minutes prior to your appointment time. [address]   Follow-Up: At [facility], you and your health needs are our priority.  As part of our continuing mission to provide you with exceptional heart care, we have created designated Provider Care Teams.  These Care Teams include your primary Cardiologist (physician) and Advanced Practice Providers (APPs -  Physician Assistants and Nurse Practitioners) who all work together to provide you with the care you need, when you need it.  We recommend signing up for the patient portal called "MyChart".  Sign up information is provided on this After Visit Summary.  MyChart is used to connect with patients for Virtual Visits (Telemedicine).  Patients are able to view lab/test results, encounter notes, upcoming appointments, etc.  Non-urgent messages can be sent to your provider as well.   To learn more about what you can do with MyChart, go to [email or web address].    Your next appointment:   6 month(s)  Provider:   Olga Millers MD

## 2023-07-06 NOTE — Progress Notes (Signed)
Jasmine Radar, FNP Reason for referral-chest pain and dizziness  HPI: 27 year old female for evaluation of chest pain and dizziness at request of Irwin Brakeman, FNP.  Patient seen in the emergency room July 2024 with chest pain.  Troponins were normal.  Hemoglobin 11.3.  Potassium 3.5, creatinine 0.82. Also with recent near syncopal episodes. Cardiology now asked to evaluate.  Patient states that she has some dizziness with at times.  This typically occurs when she is cooking or cleaning her home.  She also feels an elevated heart rate with activities.  She has not had syncope recently.  She feels dyspneic at times when she is talking.  She also notes occasional chest pain that can last an hour at a time.  It is in the substernal area and can radiate to either the right or left.  It can increase with inspiration.  There is associated dyspnea but no nausea or diaphoresis.  It resolved spontaneously.  Cardiology now asked to evaluate.  Current Outpatient Medications  Medication Sig Dispense Refill   ibuprofen (ADVIL) 400 MG tablet Take 1 tablet (400 mg total) by mouth every 6 (six) hours as needed. 30 tablet 0   No current facility-administered medications for this visit.    Allergies  Allergen Reactions   Shellfish Allergy Itching and Nausea Only     Past Medical History:  Diagnosis Date   Anxiety    Medical history non-contributory     Past Surgical History:  Procedure Laterality Date   INDUCED ABORTION     WISDOM TOOTH EXTRACTION      Social History   Socioeconomic History   Marital status: Single    Spouse name: Not on file   Number of children: Not on file   Years of education: Not on file   Highest education level: Not on file  Occupational History   Not on file  Tobacco Use   Smoking status: Former    Types: Cigars   Smokeless tobacco: Never  Vaping Use   Vaping status: Never Used  Substance and Sexual Activity   Alcohol use: Yes    Comment:  occasionally   Drug use: Yes    Types: Marijuana   Sexual activity: Yes    Birth control/protection: None  Other Topics Concern   Not on file  Social History Narrative   Not on file   Social Determinants of Health   Financial Resource Strain: Not on file  Food Insecurity: Not on file  Transportation Needs: Not on file  Physical Activity: Not on file  Stress: Not on file  Social Connections: Unknown (02/13/2022)   Received from [facility]   Social Network    Social Network: Not on file  Intimate Partner Violence: Unknown (01/05/2022)   Received from [facility]   HITS    Physically Hurt: Not on file    Insult or Talk Down To: Not on file    Threaten Physical Harm: Not on file    Scream or Curse: Not on file    Family History  Problem Relation Age of Onset   Diabetes Mother    Alcohol abuse Neg Hx    Arthritis Neg Hx    Asthma Neg Hx    Birth defects Neg Hx    Cancer Neg Hx    COPD Neg Hx    Depression Neg Hx    Drug abuse Neg Hx    Early death Neg Hx    Hearing loss Neg Hx  Heart disease Neg Hx    Hyperlipidemia Neg Hx    Hypertension Neg Hx    Kidney disease Neg Hx    Learning disabilities Neg Hx    Mental illness Neg Hx    Mental retardation Neg Hx    Miscarriages / Stillbirths Neg Hx    Stroke Neg Hx    Vision loss Neg Hx    Varicose Veins Neg Hx     ROS: no fevers or chills, productive cough, hemoptysis, dysphasia, odynophagia, melena, hematochezia, dysuria, hematuria, rash, seizure activity, orthopnea, PND, pedal edema, claudication. Remaining systems are negative.  Physical Exam:   Blood pressure 130/80, pulse 62, height 5\' 5"  (1.651 m), weight 191 lb (86.6 kg), SpO2 99%, unknown if currently breastfeeding.  General:  Well developed/well nourished in NAD Skin warm/dry Patient not depressed No peripheral clubbing Back-normal HEENT-normal/normal eyelids Neck supple/normal carotid upstroke bilaterally; no bruits; no JVD; no thyromegaly chest  - CTA/ normal expansion CV - RRR/normal S1 and S2; no murmurs, rubs or gallops;  PMI nondisplaced Abdomen -NT/ND, no HSM, no mass, + bowel sounds, no bruit 2+ femoral pulses, no bruits Ext-no edema, chords, 2+ DP Neuro-grossly nonfocal  ECG -April 25, 2023-normal sinus rhythm with nonspecific ST changes.  Personally reviewed  EKG Interpretation Date/Time:  Friday July 06 2023 13:08:29 EDT Ventricular Rate:  62 PR Interval:  124 QRS Duration:  80 QT Interval:  384 QTC Calculation: 389 R Axis:   43  Text Interpretation: Normal sinus rhythm with sinus arrhythmia Confirmed by Olga Millers ([id number]) on 07/06/2023 1:10:16 PM    A/P  1 chest pain-symptoms are very atypical.  They can occur either with exertion or at rest.  We will arrange an echocardiogram to assess LV function.  She is also describing elevated heart rate with activities as well as dizziness with activities.  We will arrange an exercise treadmill to correlate her symptoms with her rhythm strips at time of exercise.  Finally we discussed a smart watch today to correlate any symptoms of elevated heart rate with rhythm strips and she will consider purchasing.  2 near syncopal episodes-plan exercise treadmill as outlined above as well as echocardiogram.  Olga Millers, MD

## 2023-07-09 ENCOUNTER — Ambulatory Visit: Payer: 59 | Admitting: Internal Medicine

## 2023-07-31 ENCOUNTER — Telehealth: Payer: Self-pay | Admitting: Cardiology

## 2023-07-31 ENCOUNTER — Telehealth (HOSPITAL_COMMUNITY): Payer: Self-pay | Admitting: Cardiology

## 2023-07-31 NOTE — Telephone Encounter (Signed)
Patient canceled Echocardiogram and Cup ETT tests due to work conflict.  Patient stated she will call back to reschedule and will need afternoon only appointments.

## 2023-07-31 NOTE — Telephone Encounter (Signed)
Patient called and cancelled GXT and Echocardiogram scheduled for 08/01/23. She will call back to reschedule. Orders will be removed from the active echo wq and when patient calls back we will reinstate the order. Thank you.

## 2023-08-01 ENCOUNTER — Ambulatory Visit (HOSPITAL_COMMUNITY): Payer: 59

## 2023-09-14 ENCOUNTER — Emergency Department (HOSPITAL_COMMUNITY): Payer: 59

## 2023-09-14 ENCOUNTER — Encounter (HOSPITAL_COMMUNITY): Payer: Self-pay

## 2023-09-14 ENCOUNTER — Emergency Department (HOSPITAL_COMMUNITY)
Admission: EM | Admit: 2023-09-14 | Discharge: 2023-09-15 | Discharge status: Against Medical Advice | Payer: 59 | Attending: Emergency Medicine | Admitting: Emergency Medicine

## 2023-09-14 ENCOUNTER — Other Ambulatory Visit: Payer: Self-pay

## 2023-09-14 DIAGNOSIS — M25562 Pain in left knee: Secondary | ICD-10-CM | POA: Diagnosis not present

## 2023-09-14 DIAGNOSIS — Y92410 Unspecified street and highway as the place of occurrence of the external cause: Secondary | ICD-10-CM | POA: Diagnosis not present

## 2023-09-14 DIAGNOSIS — Z5321 Procedure and treatment not carried out due to patient leaving prior to being seen by health care provider: Secondary | ICD-10-CM | POA: Insufficient documentation

## 2023-09-14 DIAGNOSIS — M25561 Pain in right knee: Secondary | ICD-10-CM | POA: Diagnosis not present

## 2023-09-14 DIAGNOSIS — S0181XA Laceration without foreign body of other part of head, initial encounter: Secondary | ICD-10-CM | POA: Insufficient documentation

## 2023-09-14 DIAGNOSIS — M25571 Pain in right ankle and joints of right foot: Secondary | ICD-10-CM | POA: Diagnosis not present

## 2023-09-14 DIAGNOSIS — S8992XA Unspecified injury of left lower leg, initial encounter: Secondary | ICD-10-CM | POA: Diagnosis not present

## 2023-09-14 DIAGNOSIS — S99911A Unspecified injury of right ankle, initial encounter: Secondary | ICD-10-CM | POA: Diagnosis not present

## 2023-09-14 DIAGNOSIS — R69 Illness, unspecified: Secondary | ICD-10-CM | POA: Diagnosis not present

## 2023-09-14 DIAGNOSIS — M25522 Pain in left elbow: Secondary | ICD-10-CM | POA: Diagnosis not present

## 2023-09-14 DIAGNOSIS — M778 Other enthesopathies, not elsewhere classified: Secondary | ICD-10-CM | POA: Diagnosis not present

## 2023-09-14 DIAGNOSIS — S0101XA Laceration without foreign body of scalp, initial encounter: Secondary | ICD-10-CM | POA: Diagnosis not present

## 2023-09-14 DIAGNOSIS — S0990XA Unspecified injury of head, initial encounter: Secondary | ICD-10-CM | POA: Diagnosis not present

## 2023-09-14 DIAGNOSIS — S59902A Unspecified injury of left elbow, initial encounter: Secondary | ICD-10-CM | POA: Diagnosis not present

## 2023-09-14 MED ORDER — ACETAMINOPHEN 500 MG PO TABS
1000.0000 mg | ORAL_TABLET | Freq: Once | ORAL | Status: AC
  Administered 2023-09-14: 1000 mg via ORAL
  Filled 2023-09-14: qty 2
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
## 2023-09-15 NOTE — ED Notes (Signed)
PT left due to wait time 

## 2023-09-18 ENCOUNTER — Ambulatory Visit (HOSPITAL_COMMUNITY)
Admission: EM | Admit: 2023-09-18 | Discharge: 2023-09-18 | Disposition: A | Discharge status: Home / Self Care | Payer: Self-pay | Attending: Physician Assistant | Admitting: Physician Assistant

## 2023-09-18 ENCOUNTER — Encounter (HOSPITAL_COMMUNITY): Payer: Self-pay

## 2023-09-18 DIAGNOSIS — S0081XA Abrasion of other part of head, initial encounter: Secondary | ICD-10-CM

## 2023-09-18 DIAGNOSIS — S8001XA Contusion of right knee, initial encounter: Secondary | ICD-10-CM

## 2023-09-18 DIAGNOSIS — S0083XA Contusion of other part of head, initial encounter: Secondary | ICD-10-CM

## 2023-09-18 DIAGNOSIS — S8002XA Contusion of left knee, initial encounter: Secondary | ICD-10-CM

## 2023-09-18 MED ORDER — CYCLOBENZAPRINE HCL 5 MG PO TABS: 5.0000 mg | ORAL_TABLET | Freq: Three times a day (TID) | ORAL | 0 refills | Status: AC | PRN

## 2023-09-18 MED ORDER — NAPROXEN 500 MG PO TABS: 500.0000 mg | ORAL_TABLET | Freq: Two times a day (BID) | ORAL | 0 refills | Status: AC
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
## 2023-09-18 NOTE — Discharge Instructions (Signed)
Apply ice to face and knees 15 minutes 4 times per day Take medication as prescribed  Follow up with PCP if pain persists
